# Patient Record
Sex: Female | Born: 1935 | Race: White | Hispanic: No | Marital: Single | Smoking: Never smoker
Health system: Southern US, Community
[De-identification: ages and names within clinical notes are randomized; demographics above are authoritative.]

## PROBLEM LIST (undated history)

## (undated) DIAGNOSIS — K5792 Diverticulitis of intestine, part unspecified, without perforation or abscess without bleeding: Secondary | ICD-10-CM

## (undated) DIAGNOSIS — I890 Lymphedema, not elsewhere classified: Secondary | ICD-10-CM

## (undated) DIAGNOSIS — I340 Nonrheumatic mitral (valve) insufficiency: Secondary | ICD-10-CM

## (undated) DIAGNOSIS — E119 Type 2 diabetes mellitus without complications: Secondary | ICD-10-CM

## (undated) DIAGNOSIS — G4730 Sleep apnea, unspecified: Secondary | ICD-10-CM

## (undated) DIAGNOSIS — M353 Polymyalgia rheumatica: Secondary | ICD-10-CM

## (undated) DIAGNOSIS — I10 Essential (primary) hypertension: Secondary | ICD-10-CM

## (undated) DIAGNOSIS — E785 Hyperlipidemia, unspecified: Secondary | ICD-10-CM

## (undated) DIAGNOSIS — M1990 Unspecified osteoarthritis, unspecified site: Secondary | ICD-10-CM

## (undated) DIAGNOSIS — C801 Malignant (primary) neoplasm, unspecified: Secondary | ICD-10-CM

## (undated) DIAGNOSIS — I5022 Chronic systolic (congestive) heart failure: Secondary | ICD-10-CM

## (undated) HISTORY — PX: CARPAL TUNNEL RELEASE: SHX101

## (undated) HISTORY — DX: Unspecified osteoarthritis, unspecified site: M19.90

## (undated) HISTORY — PX: MASTECTOMY MODIFIED RADICAL: SUR848

## (undated) HISTORY — DX: Polymyalgia rheumatica: M35.3

## (undated) HISTORY — DX: Type 2 diabetes mellitus without complications: E11.9

## (undated) HISTORY — PX: JOINT REPLACEMENT: SHX530

## (undated) HISTORY — DX: Diverticulitis of intestine, part unspecified, without perforation or abscess without bleeding: K57.92

## (undated) HISTORY — DX: Nonrheumatic mitral (valve) insufficiency: I34.0

## (undated) HISTORY — DX: Essential (primary) hypertension: I10

## (undated) HISTORY — DX: Hyperlipidemia, unspecified: E78.5

## (undated) HISTORY — PX: SIGMOIDECTOMY: SHX176

## (undated) HISTORY — PX: OTHER SURGICAL HISTORY: SHX169

## (undated) HISTORY — DX: Malignant (primary) neoplasm, unspecified: C80.1

## (undated) HISTORY — DX: Chronic systolic (congestive) heart failure: I50.22

---

## 1985-12-05 DIAGNOSIS — C801 Malignant (primary) neoplasm, unspecified: Secondary | ICD-10-CM

## 1985-12-05 HISTORY — PX: BREAST LUMPECTOMY: SHX2

## 1985-12-05 HISTORY — DX: Malignant (primary) neoplasm, unspecified: C80.1

## 2013-05-27 DIAGNOSIS — E7800 Pure hypercholesterolemia, unspecified: Secondary | ICD-10-CM | POA: Insufficient documentation

## 2013-05-27 DIAGNOSIS — M1990 Unspecified osteoarthritis, unspecified site: Secondary | ICD-10-CM | POA: Insufficient documentation

## 2013-05-27 DIAGNOSIS — D34 Benign neoplasm of thyroid gland: Secondary | ICD-10-CM | POA: Insufficient documentation

## 2013-05-27 DIAGNOSIS — E119 Type 2 diabetes mellitus without complications: Secondary | ICD-10-CM | POA: Insufficient documentation

## 2013-05-27 DIAGNOSIS — K5730 Diverticulosis of large intestine without perforation or abscess without bleeding: Secondary | ICD-10-CM | POA: Insufficient documentation

## 2013-05-27 DIAGNOSIS — Z853 Personal history of malignant neoplasm of breast: Secondary | ICD-10-CM | POA: Insufficient documentation

## 2013-05-27 DIAGNOSIS — I890 Lymphedema, not elsewhere classified: Secondary | ICD-10-CM | POA: Insufficient documentation

## 2013-07-02 ENCOUNTER — Encounter: Payer: Self-pay | Admitting: Family Medicine

## 2013-07-03 DIAGNOSIS — I340 Nonrheumatic mitral (valve) insufficiency: Secondary | ICD-10-CM | POA: Insufficient documentation

## 2013-07-05 ENCOUNTER — Encounter: Payer: Self-pay | Admitting: Family Medicine

## 2014-06-24 DIAGNOSIS — I447 Left bundle-branch block, unspecified: Secondary | ICD-10-CM | POA: Insufficient documentation

## 2014-11-20 ENCOUNTER — Ambulatory Visit: Payer: Self-pay | Admitting: Family Medicine

## 2014-12-08 ENCOUNTER — Encounter: Payer: Self-pay | Admitting: Family Medicine

## 2014-12-29 ENCOUNTER — Ambulatory Visit: Payer: Self-pay | Admitting: Gastroenterology

## 2015-01-05 ENCOUNTER — Encounter: Payer: Self-pay | Admitting: Family Medicine

## 2015-02-03 ENCOUNTER — Encounter
Admit: 2015-02-03 | Disposition: A | Discharge status: Home / Self Care | Payer: Self-pay | Attending: Family Medicine | Admitting: Family Medicine

## 2015-03-30 LAB — SURGICAL PATHOLOGY

## 2015-12-21 ENCOUNTER — Encounter: Payer: Self-pay | Admitting: Occupational Therapy

## 2015-12-21 ENCOUNTER — Ambulatory Visit: Payer: Medicare Other | Attending: Family Medicine | Admitting: Occupational Therapy

## 2015-12-21 DIAGNOSIS — I972 Postmastectomy lymphedema syndrome: Secondary | ICD-10-CM | POA: Insufficient documentation

## 2015-12-21 NOTE — Therapy (Signed)
[place] [facility] [address], [place], [address] Phone: [phone]   Fax:  [phone]  Occupational Therapy Treatment  Patient Details  Name: Natasha Young MRN: [id number] Date of Birth: Jun 18, 1933 No Data Recorded  Encounter Date: 12/21/2015      OT End of Session - 12/21/15 1819    Visit Number 1   Number of Visits 4   Date for OT Re-Evaluation 01/18/16   OT Start Time 1403   OT Stop Time 1456   OT Time Calculation (min) 53 min   Activity Tolerance Patient tolerated treatment well   Behavior During Therapy [facility] for tasks assessed/performed      Past Medical History  Diagnosis Date  . Cancer ([place])   . Diabetes mellitus without complication [facility])     Past Surgical History  Procedure Laterality Date  . Joint replacement    .  r hipreplacement    . Mastectomy modified radical Bilateral   . Carpal tunnel release Right     There were no vitals filed for this visit.  Visit Diagnosis:  Postmastectomy lymphedema syndrome - Plan: Ot plan of care cert/re-cert      Subjective Assessment - 12/21/15 1802    Subjective  We did roadtrip in Oct that  I drove most of the time , then flew down to [place] and carried suitcase and back pack - that was when my shoulder started bother me and  Ifelt like I was starting to get more swelling    Patient Stated Goals To get my lymphedema under control , new garments, and shoulder better    Currently in Pain? Yes   Pain Score 5    Pain Location Arm   Pain Orientation Left   Pain Descriptors / Indicators Aching;Pins and needles;Numbness   Pain Onset More than a month ago             LYMPHEDEMA/ONCOLOGY QUESTIONNAIRE - 12/21/15 1805    Type   Cancer Type L breast CA 23 yrs ago - with double mastectmies    Date Lymphedema/Swelling Started   Date --  Nov 16 flared up - but first episode 1985   What other symptoms do you have   Are you Having Heaviness or  Tightness Yes   Are you having Pain Yes   Are you having pitting edema No   Is it Hard or Difficult finding clothes that fit Yes   Do you have infections No   Lymphedema Stage   Stage STAGE 2 SPONTANEOUSLY IRREVERSIBLE   Right Upper Extremity Lymphedema   15 cm Proximal to Olecranon Process 40.5 cm   10 cm Proximal to Olecranon Process 36 cm   Olecranon Process 31.6 cm   15 cm Proximal to Ulnar Styloid Process 30.5 cm   10 cm Proximal to Ulnar Styloid Process 27 cm   Just Proximal to Ulnar Styloid Process 19.6 cm   Across Hand at [employer] 19.8 cm   At [place] of 2nd Digit 7.5 cm   At [facility] of Thumb 7.1 cm   Left Upper Extremity Lymphedema   15 cm Proximal to Olecranon Process 37.2 cm   10 cm Proximal to Olecranon Process 37.2 cm   Olecranon Process 34 cm   15 cm Proximal to Ulnar Styloid Process 35 cm   10 cm Proximal to Ulnar Styloid Process 30.6 cm   Just Proximal to Ulnar Styloid Process 20.8 cm   Across Hand at  [employer] 20.8 cm   At [place] of 2nd Digit 7.6 cm   At [facility] of Thumb 7.1 cm                         OT Education - 2016/01/02 1818    Education provided Yes   Education Details Findings of eval and plan    Person(s) Educated Patient   Methods Explanation;Demonstration;Tactile cues;Verbal cues   Comprehension Returned demonstration;Verbalized understanding          OT Short Term Goals - 01-02-2016 1826    OT SHORT TERM GOAL #1   Title Circumfernce in L hand decrease by at least .5 cm to fit in compressoin glove again    Baseline increase by .8 cm at wrist and hand    Time 2   Period Weeks   Status New           OT Long Term Goals - 02-Jan-2016 1827    OT LONG TERM GOAL #1   Title Pt to be ind in wearing of new power sleeve for night garment, new daytime compressoin sleeve  and compression bra   Baseline Pt has jovipak but do not wear a compression bra for thorasic lymphedeam    Time 4   Period Weeks   Status New                Plan - January 02, 2016 1820    Clinical Impression Statement Pt present iwth increase lymphdema in hand and wrist since year ago - pt report was not able to wear glove - and appear sleeve cause turniquet at wrist - causing some CTS in  L hand with increase use - pt was issud isotoner glove under night time sleeve to decrease measurments- wash and stretch  fingers of glove to assess if still fit - and then will remeasure pt on Friday - to be send for new nigh time powersleeve over Solaris , new daytime compression sleeve  / copmression bra to wear with high risk activities -    Pt will benefit from skilled therapeutic intervention in order to improve on the following deficits (Retired) Impaired UE functional use;Increased edema;Decreased strength   Rehab Potential Good   OT Frequency 1x / week   OT Duration 4 weeks   OT Treatment/Interventions Self-care/ADL training;Manual lymph drainage;Patient/family education   Plan Reassess circumference Friday    OT Home Exercise Plan Wear isotoner glove under night time Solaris   Consulted and Agree with Plan of Care Patient          G-Codes - 01/02/16 1836    Functional Assessment Tool Used Jeanie Cooks; circumference, ROM , pain .clinical judgement   Functional Limitation Self care   Self Care Current Status [phone]) At least 20 percent but less than 40 percent impaired, limited or restricted   Self Care Goal Status [id number]) At least 1 percent but less than 20 percent impaired, limited or restricted      Problem List There are no active problems to display for this patient.   Rosalyn Gess OTR/L,CLT Jan 02, 2016, 6:40 PM  [place] [facility] [address], [place], [address] Phone: [phone]   Fax:  [phone]  Name: DIANCA LECHMAN MRN: [id number] Date of Birth: Dec 02, 1933

## 2015-12-21 NOTE — Patient Instructions (Signed)
Pt to wear isotoner glove under night time compression sleeve to decrease hand measurements- and wash and stretch daytime compression glove - the fingers  Then wear during PT sessions this week - sleeve and glove -  Will remeasure on Friday

## 2015-12-25 ENCOUNTER — Ambulatory Visit: Payer: Medicare Other | Admitting: Occupational Therapy

## 2015-12-25 DIAGNOSIS — I972 Postmastectomy lymphedema syndrome: Secondary | ICD-10-CM

## 2015-12-25 NOTE — Therapy (Signed)
[place] [facility] [address] [place], [address] Phone: [phone]   Fax:  [phone]  Occupational Therapy Treatment  Patient Details  Name: Natasha Young MRN: [id number] Date of Birth: 1933/08/12 No Data Recorded  Encounter Date: 12/25/2015      OT End of Session - 12/25/15 1422    Visit Number 2   Number of Visits 4   Date for OT Re-Evaluation 01/18/16   OT Start Time 1314   OT Stop Time 1346   OT Time Calculation (min) 32 min   Activity Tolerance Patient tolerated treatment well   Behavior During Therapy [facility] for tasks assessed/performed      Past Medical History  Diagnosis Date  . Cancer ([place])   . Diabetes mellitus without complication [facility])     Past Surgical History  Procedure Laterality Date  . Joint replacement    .  r hipreplacement    . Mastectomy modified radical Bilateral   . Carpal tunnel release Right     There were no vitals filed for this visit.  Visit Diagnosis:  Postmastectomy lymphedema syndrome      Subjective Assessment - 12/25/15 1356    Subjective  I wore the glove you gave me to wear at night time under my night time garment - I had biopsy on my upper arm earlier this week - was able to wear my compression glove during day from 10am to about 7 pm but with  some pins and needles in my fingers    Patient Stated Goals To get my lymphedema under control , new garments, and shoulder better    Currently in Pain? No/denies             LYMPHEDEMA/ONCOLOGY QUESTIONNAIRE - 12/25/15 1319    Left Upper Extremity Lymphedema   15 cm Proximal to Olecranon Process 37 cm   10 cm Proximal to Olecranon Process 36.5 cm   Olecranon Process 34.5 cm   15 cm Proximal to Ulnar Styloid Process 34.1 cm   10 cm Proximal to Ulnar Styloid Process 30 cm   Just Proximal to Ulnar Styloid Process 19.3 cm   Across Hand at [employer] 20.8 cm   At [place] of 2nd Digit 7.1 cm   At [facility] of  Thumb 7 cm                 OT Treatments/Exercises (OP) - 12/25/15 0001    ADLs   ADL Comments Pt again ed on wearing excisting garments - pt to wear anothet week isotoner glove under night sleeve - and get measured for compressoin bra, daytme  daysleeve and glove -as well as powersleeve for night time   Manual Therapy   Manual therapy comments measurements taken - see flowsheet                 OT Education - 12/25/15 1422    Education provided Yes   Education Details New compresson needed   Person(s) Educated Patient   Methods Explanation;Demonstration   Comprehension Verbalized understanding          OT Short Term Goals - 12/21/15 1826    OT SHORT TERM GOAL #1   Title Circumfernce in L hand decrease by at least .5 cm to fit in compressoin glove again    Baseline increase by .8 cm at wrist and hand    Time 2   Period Weeks   Status New  OT Long Term Goals - 12/21/15 1827    OT LONG TERM GOAL #1   Title Pt to be ind in wearing of new power sleeve for night garment, new daytime compressoin sleeve  and compression bra   Baseline Pt has jovipak but do not wear a compression bra for thorasic lymphedeam    Time 4   Period Weeks   Status New               Plan - 12/25/15 1423    Clinical Impression Statement Pt measurements decrease in 2nd digiit - wrist  and even forearm and upper arm - elbow was about the same - pt had biiopsy done on upper arm earlier this week - pt in need for new compression day sleeve , glove , night time  powersleeve and copmression bra - with that she should be good  with working out  and CTS should decrease  - pt to contact this OT when garmens arrive    Pt will benefit from skilled therapeutic intervention in order to improve on the following deficits (Retired) Impaired UE functional use;Increased edema;Decreased strength   Rehab Potential Good   OT Frequency 1x / week   OT Duration 4 weeks   OT  Treatment/Interventions Self-care/ADL training;Manual lymph drainage;Patient/family education   Plan assess new compression garments    OT Home Exercise Plan Wear isotoner glove under night time Solar[employer]   Consulted and Agree with Plan of Care Patient        Problem List There are no active problems to display for this patient.   Rosalyn Gess OTR/L,CLT 12/25/2015, 2:27 PM  [place] [facility] [address] [place], [address] Phone: [phone]   Fax:  [phone]  Name: Natasha Young MRN: [id number] Date of Birth: Sep 17, 1933

## 2015-12-25 NOTE — Patient Instructions (Signed)
Same than last time - and get measured next week for new garments

## 2016-02-08 DIAGNOSIS — M2560 Stiffness of unspecified joint, not elsewhere classified: Secondary | ICD-10-CM | POA: Insufficient documentation

## 2016-02-09 ENCOUNTER — Other Ambulatory Visit: Payer: Self-pay | Admitting: Family Medicine

## 2016-02-09 DIAGNOSIS — Z780 Asymptomatic menopausal state: Secondary | ICD-10-CM

## 2016-02-09 DIAGNOSIS — Z79899 Other long term (current) drug therapy: Secondary | ICD-10-CM

## 2016-02-15 ENCOUNTER — Ambulatory Visit
Admission: RE | Admit: 2016-02-15 | Discharge: 2016-02-15 | Disposition: A | Discharge status: Home / Self Care | Payer: Medicare Other | Source: Ambulatory Visit | Attending: Family Medicine | Admitting: Family Medicine

## 2016-02-15 DIAGNOSIS — Z780 Asymptomatic menopausal state: Secondary | ICD-10-CM | POA: Diagnosis present

## 2016-02-15 DIAGNOSIS — Z79899 Other long term (current) drug therapy: Secondary | ICD-10-CM | POA: Diagnosis present

## 2016-02-16 DIAGNOSIS — I428 Other cardiomyopathies: Secondary | ICD-10-CM | POA: Insufficient documentation

## 2016-04-27 DIAGNOSIS — M160 Bilateral primary osteoarthritis of hip: Secondary | ICD-10-CM | POA: Insufficient documentation

## 2016-04-27 DIAGNOSIS — Z96641 Presence of right artificial hip joint: Secondary | ICD-10-CM | POA: Insufficient documentation

## 2016-07-08 DIAGNOSIS — M353 Polymyalgia rheumatica: Secondary | ICD-10-CM | POA: Insufficient documentation

## 2018-04-10 ENCOUNTER — Other Ambulatory Visit: Payer: Self-pay | Admitting: Family Medicine

## 2018-04-10 DIAGNOSIS — Z79899 Other long term (current) drug therapy: Secondary | ICD-10-CM

## 2018-04-10 DIAGNOSIS — M899 Disorder of bone, unspecified: Secondary | ICD-10-CM

## 2018-05-17 ENCOUNTER — Ambulatory Visit
Admission: RE | Admit: 2018-05-17 | Discharge: 2018-05-17 | Disposition: A | Discharge status: Home / Self Care | Payer: Medicare Other | Source: Ambulatory Visit | Attending: Family Medicine | Admitting: Family Medicine

## 2018-05-17 DIAGNOSIS — Z79899 Other long term (current) drug therapy: Secondary | ICD-10-CM | POA: Insufficient documentation

## 2018-05-17 DIAGNOSIS — M899 Disorder of bone, unspecified: Secondary | ICD-10-CM | POA: Diagnosis present

## 2018-07-23 ENCOUNTER — Other Ambulatory Visit: Payer: Self-pay

## 2018-07-23 ENCOUNTER — Ambulatory Visit: Payer: Medicare Other | Attending: Nuclear Medicine | Admitting: Occupational Therapy

## 2018-07-23 DIAGNOSIS — I972 Postmastectomy lymphedema syndrome: Secondary | ICD-10-CM | POA: Diagnosis present

## 2018-07-23 NOTE — Therapy (Signed)
[place] [facility] [address], [place], [address] Phone: [phone]   Fax:  [phone]  Occupational Therapy Evaluation  Patient Details  Name: Natasha Young MRN: [id number] Date of Birth: 1933/03/06 Referring Provider: Hoy Morn   Encounter Date: 07/23/2018  OT End of Session - 07/23/18 2053    Visit Number  1    Number of Visits  6    Date for OT Re-Evaluation  09/03/18    OT Start Time  1350    OT Stop Time  1440    OT Time Calculation (min)  50 min    Activity Tolerance  Patient tolerated treatment well    Behavior During Therapy  [facility] for tasks assessed/performed       Past Medical History:  Diagnosis Date  . Cancer ([place])   . Diabetes mellitus without complication ([place])     The histories are not reviewed yet. Please review them in the "History" navigator section and refresh this [place].  There were no vitals filed for this visit.  Subjective Assessment - 07/23/18 2041    Subjective   I had catch scratch about month ago on my R arm  - and lymphfluid was just dripping out of my L wrist 2 days later -and then it scratch me again about day ago - I did take month ago an antibiotic - but it feels like my forearm and wrist is more swollen - I was wondering about if I can get a pump  for my L UE lymphedeam and trunk - I do not wear a bra  - so I do not wear the jovipak breast little pad     Patient Stated Goals  To get my lymphedema in my L arm  under control , new garments and maybe pump     Currently in Pain?  No/denies        [facility] OT Assessment - 07/23/18 0001      Assessment   Medical Diagnosis  L UE and thoracic lymphedema     Referring Provider  [place]    Onset Date/Surgical Date  05/05/18    Hand Dominance  Right      Precautions   Precaution Comments  lymphedema      Home  Environment   Lives With  Spouse      Prior Function   Vocation  Retired       LYMPHEDEMA/ONCOLOGY  QUESTIONNAIRE - 07/23/18 1406      Right Upper Extremity Lymphedema   15 cm Proximal to Olecranon Process  39 cm    10 cm Proximal to Olecranon Process  35.5 cm    Olecranon Process  32.5 cm    15 cm Proximal to Ulnar Styloid Process  30.5 cm    10 cm Proximal to Ulnar Styloid Process  27 cm    Just Proximal to Ulnar Styloid Process  20 cm    Across Hand at [employer]  20 cm    At [place] of 2nd Digit  7 cm    At [facility] of Thumb  7 cm      Left Upper Extremity Lymphedema   15 cm Proximal to Olecranon Process  38.5 cm    10 cm Proximal to Olecranon Process  37.3 cm    Olecranon Process  34.5 cm    15 cm Proximal to Ulnar Styloid Process  35.5 cm    10 cm Proximal to Ulnar Styloid Process  32 cm    Just Proximal to Ulnar Styloid Process  20 cm    Across Hand at [employer]  20.4 cm    At [place] of 2nd Digit  7.1 cm    At [facility] of Thumb  7.3 cm         Pt cont with Solaris compression with power sleeve over at night tine  pt's daytime compresson is more than 82 yrs old- pt provided with isotoner glove and tubigrip D  Over hand to elbow For daytime  To wear for week  Need new one - but want to decrease forearm and wrist prior to measurement  Pt not candidate for pump  because of her cardiac history             OT Education - 07/23/18 2053    Education provided  Yes    Education Details  findings of eval and homeprogram - POC    Person(s) Educated  Patient    Methods  Explanation;Demonstration    Comprehension  Verbalized understanding;Returned demonstration       OT Short Term Goals - 12/21/15 1826      OT SHORT TERM GOAL #1   Title  Circumfernce in L hand decrease by at least .5 cm to fit in compressoin glove again     Baseline  increase by .8 cm at wrist and hand     Time  2    Period  Weeks    Status  New        OT Long Term Goals - 07/23/18 2102      OT LONG TERM GOAL #1   Title  L UE circumfernce decrease in forearm and wrist to get measured for  new daytime compression sleeve     Baseline  increase compare to last time and 5 cm in forearm increase compare to the R -    Time  3    Period  Weeks    Status  New    Target Date  08/13/18      OT LONG TERM GOAL #2   Title  Pt to be independent in wearing correct compression to maintain lymphedema circumference in L UE to prevent infection     Baseline  Pt had infection from cat scratch to R hand -and arrive at eval with another cat scratch     Time  6    Period  Weeks    Status  New    Target Date  09/03/18            Plan - 07/23/18 2054    Clinical Impression Statement  Pt was seen in 2/17 the last time for her L UE and thoracic lymphedema - pt return this date for evaluation- per pt her cat scratch her on the R arm and 2 days later her L wrist was oozzing and draining lymph fluid - she went on antibiotic - she arrive this date again with new cat scratch on R hand - pt daytime compression sleeve - Jobst elvarex soft - is mroe than 82 years old - and not provided enough compression anymore - her measurements increaes 2 cm in foream , 0.7 at wrist, and 1.5 cm at upper arm compare to Febr 17 - Pt cont to be increase now compare to the R arm - 5 cm at forearm , 2 at elbow and 1.3 at upper arm - explain to pt that I would not recommend pump for her because  of her cardiac history - pt was SOB just walking in the clinic , increase edema in bilateral LE , EF per pt 22% and  on low doxe predisone - plan to decrease forearm and wrist measurements for pt to get measure for new daytime sleeve - night time Solaris still providing enough compression -    Occupational performance deficits (Please refer to evaluation for details):  ADL's;Rest and Sleep    Rehab Potential  Good    Current Impairments/barriers affecting progress:  Had lymphedema since 1985    OT Frequency  1x / week    OT Duration  6 weeks    OT Treatment/Interventions  Self-care/ADL training;Manual lymph drainage;Patient/family  education    Plan  assess measurements in week if decrease and can get measure for daytime garment    Clinical Decision Making  Limited treatment options, no task modification necessary    OT Home Exercise Plan  see pt instruction    Consulted and Agree with Plan of Care  Patient       Patient will benefit from skilled therapeutic intervention in order to improve the following deficits and impairments:  Increased edema, Impaired UE functional use  Visit Diagnosis: Postmastectomy lymphedema syndrome - Plan: Ot plan of care cert/re-cert    Problem List There are no active problems to display for this patient.   Rosalyn Gess OTR/l,CLT 07/23/2018, 9:07 PM  [place] [facility] [address] [place], [address] Phone: [phone]   Fax:  [phone]  Name: Natasha Young MRN: [id number] Date of Birth: 02-03-1933

## 2018-07-23 NOTE — Patient Instructions (Signed)
Pt cont with Solaris compression with power sleeve over at night tine  pt's daytime compresson is more than 82 yrs old- pt provided with isotoner glove and tubigrip D  Over hand to elbow For daytime  Need new one - but want to decrease forearm and wrist prior to measurement  Pt not candidate because of her cardiac history

## 2018-07-31 ENCOUNTER — Ambulatory Visit: Payer: Medicare Other | Admitting: Occupational Therapy

## 2018-07-31 DIAGNOSIS — I972 Postmastectomy lymphedema syndrome: Secondary | ICD-10-CM | POA: Diagnosis not present

## 2018-07-31 NOTE — Patient Instructions (Signed)
Same as last time  

## 2018-07-31 NOTE — Therapy (Signed)
[place] [facility] [address], [place], [address] Phone: [phone]   Fax:  [phone]  Occupational Therapy Treatment  Patient Details  Name: Natasha Young MRN: [id number] Date of Birth: [birth year] Referring Provider: Hoy Morn   Encounter Date: 07/31/2018  OT End of Session - 07/31/18 0932    Visit Number  2    Number of Visits  6    Date for OT Re-Evaluation  09/03/18    OT Start Time  0900    OT Stop Time  0930    OT Time Calculation (min)  30 min    Activity Tolerance  Patient tolerated treatment well    Behavior During Therapy  [facility] for tasks assessed/performed       Past Medical History:  Diagnosis Date  . Cancer ([place])   . Diabetes mellitus without complication ([place])     The histories are not reviewed yet. Please review them in the "History" navigator section and refresh this [place].  There were no vitals filed for this visit.  Subjective Assessment - 07/31/18 0930    Subjective   I just got up - and kept my Tribute on so you can see my arm - and  I did wear the daytime sleeve with glove and tubigrip -     Patient Stated Goals  To get my lymphedema in my L arm  under control , new garments and maybe pump     Currently in Pain?  No/denies          LYMPHEDEMA/ONCOLOGY QUESTIONNAIRE - 07/31/18 0909      Left Upper Extremity Lymphedema   15 cm Proximal to Olecranon Process  38.5 cm    10 cm Proximal to Olecranon Process  37 cm    Olecranon Process  34.5 cm    15 cm Proximal to Ulnar Styloid Process  35 cm    10 cm Proximal to Ulnar Styloid Process  30.4 cm    Just Proximal to Ulnar Styloid Process  19 cm    Across Hand at [employer]  20 cm        Pt arrive with her Tribute night time sleeve on - still have dimples on forearm and upper arm - but would recommend getting new powersleeve    Measurements taken for L UE - see flowsheet- wrist and forearm did decrease   pt's daytime  compresson is more than 82 yrs old- pt provided with isotoner glove and new tubigrip D  Over hand to elbow For daytime  To wear for another week  Need new daytime sleeve  - but want to decrease forearm and wrist prior to measurement  Pt not candidate for pump  because of her cardiac history               OT Education - 07/31/18 0932    Education provided  Yes    Education Details  progress and cont to compression for another week     Person(s) Educated  Patient    Methods  Explanation;Demonstration    Comprehension  Verbalized understanding;Returned demonstration       OT Short Term Goals - 12/21/15 1826      OT SHORT TERM GOAL #1   Title  Circumfernce in L hand decrease by at least .5 cm to fit in compressoin glove again     Baseline  increase by .8 cm at wrist and hand     Time  2    Period  Weeks    Status  New        OT Long Term Goals - 07/23/18 2102      OT LONG TERM GOAL #1   Title  L UE circumfernce decrease in forearm and wrist to get measured for new daytime compression sleeve     Baseline  increase compare to last time and 5 cm in forearm increase compare to the R -    Time  3    Period  Weeks    Status  New    Target Date  08/13/18      OT LONG TERM GOAL #2   Title  Pt to be independent in wearing correct compression to maintain lymphedema circumference in L UE to prevent infection     Baseline  Pt had infection from cat scratch to R hand -and arrive at eval with another cat scratch     Time  6    Period  Weeks    Status  New    Target Date  09/03/18            Plan - 07/31/18 0933    Clinical Impression Statement  Pt did show decrease circumference at wrist and forearm since last week - pt to cont with her old daysleeve with isotoner glove and tubigrip - for another week and should be ready to get measured for new daysleeve and glove - her Lelan Pons she arrive in with power sleeve - and had some dimples on forearm and upper arm - would  recommend new powersleeve     Occupational performance deficits (Please refer to evaluation for details):  ADL's;Rest and Sleep    Rehab Potential  Good    Current Impairments/barriers affecting progress:  Had lymphedema since 1985    OT Frequency  1x / week    OT Duration  6 weeks    OT Treatment/Interventions  Self-care/ADL training;Manual lymph drainage;Patient/family education    Plan  assess measurements in week if decrease and can get measure for daytime garment    Clinical Decision Making  Limited treatment options, no task modification necessary    OT Home Exercise Plan  see pt instruction    Consulted and Agree with Plan of Care  Patient       Patient will benefit from skilled therapeutic intervention in order to improve the following deficits and impairments:  Increased edema, Impaired UE functional use  Visit Diagnosis: Postmastectomy lymphedema syndrome    Problem List There are no active problems to display for this patient.   Rosalyn Gess OTR/L,CLT 07/31/2018, 9:35 AM  [place] [facility] [address] [place], [address] Phone: [phone]   Fax:  [phone]  Name: MERVE HOTARD MRN: [id number] Date of Birth: [birth year]

## 2018-08-07 ENCOUNTER — Ambulatory Visit: Payer: Medicare Other | Attending: Family Medicine | Admitting: Occupational Therapy

## 2018-08-07 DIAGNOSIS — I972 Postmastectomy lymphedema syndrome: Secondary | ICD-10-CM | POA: Diagnosis not present

## 2018-08-07 NOTE — Patient Instructions (Signed)
Same HEP - and get measured for garments next week

## 2018-08-07 NOTE — Therapy (Signed)
[facility] [address], [place], [address] Phone: [phone]   Fax:  [phone]  Occupational Therapy Treatment  Patient Details  Name: Natasha Young MRN: [id number] Date of Birth: Jun 17, 1933 Referring Provider: Hoy Morn   Encounter Date: 08/07/2018  OT End of Session - 08/07/18 1030    Visit Number  3    Number of Visits  6    Date for OT Re-Evaluation  09/03/18    OT Start Time  0951    OT Stop Time  1010    OT Time Calculation (min)  19 min    Activity Tolerance  Patient tolerated treatment well    Behavior During Therapy  [facility] for tasks assessed/performed       Past Medical History:  Diagnosis Date  . Cancer ([place])   . Diabetes mellitus without complication ([place])     The histories are not reviewed yet. Please review them in the "History" navigator section and refresh this [place].  There were no vitals filed for this visit.  Subjective Assessment - 08/07/18 1027    Subjective   Today it maybe not a good day for me to come and get measured - I ate [place] food last night with  a lot of sodium     Patient Stated Goals  To get my lymphedema in my L arm  under control , new garments and maybe pump     Currently in Pain?  Yes    Pain Score  4     Pain Location  Elbow    Pain Orientation  Left    Pain Descriptors / Indicators  Aching    Pain Type  Acute pain    Multiple Pain Sites  No          LYMPHEDEMA/ONCOLOGY QUESTIONNAIRE - 08/07/18 0954      Left Upper Extremity Lymphedema   15 cm Proximal to Olecranon Process  38.2 cm    10 cm Proximal to Olecranon Process  37 cm    Olecranon Process  34 cm    15 cm Proximal to Ulnar Styloid Process  35 cm    10 cm Proximal to Ulnar Styloid Process  31.2 cm    Just Proximal to Ulnar Styloid Process  19 cm    Across Hand at [employer]  20 cm         Pt arrive with her daytime Jobst Elvarex old sleeve and tubigrip over forearm and  isotoner glove  She wear her Tribute night time sleeve at night time - still have dimples on forearm and upper arm -pt  getting new powersleeve    Measurements taken for L UE - see flowsheet- decrease again - even with her eating high sodium meal yesterday   pt's daytime compresson is more than 82 yrs old- pt provided with isotoner glove and new tubigrip D Over hand to elbow For daytimeand to get measured for new daytime sleeve this time Elvarex soft  Pt not candidatefor pumpbecause of her cardiac history  Pt to contact me when she get her garments                OT Education - 08/07/18 1029    Education provided  Yes    Education Details  get measured and when to replace and come back for assessess fit    Person(s) Educated  Patient    Methods  Demonstration    Comprehension  Verbalized  understanding;Returned demonstration          OT Long Term Goals - 07/23/18 2102      OT LONG TERM GOAL #1   Title  L UE circumfernce decrease in forearm and wrist to get measured for new daytime compression sleeve     Baseline  increase compare to last time and 5 cm in forearm increase compare to the R -    Time  3    Period  Weeks    Status  New    Target Date  08/13/18      OT LONG TERM GOAL #2   Title  Pt to be independent in wearing correct compression to maintain lymphedema circumference in L UE to prevent infection     Baseline  Pt had infection from cat scratch to R hand -and arrive at eval with another cat scratch     Time  6    Period  Weeks    Status  New    Target Date  09/03/18            Plan - 08/07/18 1032    Clinical Impression Statement  Pt showed decrease circumference in L UE with increase compression over her old sleeve - even with her increase sodium meal yesterday - pt to get measured for new Tribute powersleeve and then daytime  Jobst Elvarex soft     Occupational performance deficits (Please refer to evaluation for details):  ADL's;Rest and  Sleep    Rehab Potential  Good    Current Impairments/barriers affecting progress:  Had lymphedema since 1985    OT Frequency  1x / week    OT Duration  6 weeks    OT Treatment/Interventions  Self-care/ADL training;Manual lymph drainage;Patient/family education    Plan  assess measurements and fit of garments     Clinical Decision Making  Limited treatment options, no task modification necessary    OT Home Exercise Plan  see pt instruction    Consulted and Agree with Plan of Care  Patient       Patient will benefit from skilled therapeutic intervention in order to improve the following deficits and impairments:  Increased edema, Impaired UE functional use  Visit Diagnosis: Postmastectomy lymphedema syndrome    Problem List There are no active problems to display for this patient.   Rosalyn Gess OTR/L,CLT 08/07/2018, 10:35 AM  [facility] [address], [place], [address] Phone: [phone]   Fax:  [phone]  Name: Natasha Young MRN: [id number] Date of Birth: [birth year]

## 2019-05-31 DIAGNOSIS — I5022 Chronic systolic (congestive) heart failure: Secondary | ICD-10-CM | POA: Insufficient documentation

## 2019-07-03 DIAGNOSIS — Z950 Presence of cardiac pacemaker: Secondary | ICD-10-CM

## 2019-07-03 HISTORY — PX: INSERT / REPLACE / REMOVE PACEMAKER: SUR710

## 2019-07-03 HISTORY — DX: Presence of cardiac pacemaker: Z95.0

## 2019-07-04 DIAGNOSIS — Z95810 Presence of automatic (implantable) cardiac defibrillator: Secondary | ICD-10-CM | POA: Insufficient documentation

## 2020-03-10 DIAGNOSIS — N321 Vesicointestinal fistula: Secondary | ICD-10-CM | POA: Insufficient documentation

## 2020-06-23 ENCOUNTER — Other Ambulatory Visit: Payer: Self-pay | Admitting: Family Medicine

## 2020-06-23 DIAGNOSIS — Z7952 Long term (current) use of systemic steroids: Secondary | ICD-10-CM

## 2020-06-23 DIAGNOSIS — Z780 Asymptomatic menopausal state: Secondary | ICD-10-CM

## 2020-10-05 ENCOUNTER — Ambulatory Visit (INDEPENDENT_AMBULATORY_CARE_PROVIDER_SITE_OTHER): Payer: Medicare Other | Admitting: Internal Medicine

## 2020-10-05 DIAGNOSIS — Z7189 Other specified counseling: Secondary | ICD-10-CM | POA: Diagnosis not present

## 2020-10-05 DIAGNOSIS — Z9989 Dependence on other enabling machines and devices: Secondary | ICD-10-CM

## 2020-10-05 DIAGNOSIS — I5022 Chronic systolic (congestive) heart failure: Secondary | ICD-10-CM | POA: Diagnosis not present

## 2020-10-05 DIAGNOSIS — G4733 Obstructive sleep apnea (adult) (pediatric): Secondary | ICD-10-CM | POA: Diagnosis not present

## 2020-10-05 NOTE — Progress Notes (Signed)
[facility] [place], [place] [address]  Pulmonary Sleep Medicine   Office Visit Note  Patient Name: Natasha Young DOB: [birth year] MRN [id number]    Chief Complaint: Obstructive Sleep Apnea visit  Brief History:  Natasha Young is seen today for follow up The patient has a 2 year history of sleep apnea. Patient is using PAP nightly.  The patient feels more rested after sleeping with PAP.  The patient reports beneft from PAP use. Reported sleepiness is  improved and the Epworth Sleepiness Score is 3 out of 24. The patient is not taking naps. The patient complains of the following: dry mouth. This will cause her to remove the mask.The humidifier is set on 6.  The compliance download shows excellent compliance with an average use time of 6.7 hours. The AHI is 5.2  The patient does not complain of limb movements disrupting sleep.  ROS  General: (-) fever, (-) chills, (-) night sweat Nose and Sinuses: (-) nasal stuffiness or itchiness, (-) postnasal drip, (-) nosebleeds, (-) sinus trouble. Mouth and Throat: (-) sore throat, (-) hoarseness. Neck: (-) swollen glands, (-) enlarged thyroid, (-) neck pain. Respiratory: - cough, - shortness of breath, - wheezing. Neurologic: - numbness, - tingling. Psychiatric: - anxiety, - depression   Current Medication: Outpatient Encounter Medications as of 84/12/2019  Medication Sig  . carvedilol (COREG) 3.125 MG tablet TAKE (1) TABLET BY MOUTH TWICE DAILY WITH MEALS  . DULoxetine (CYMBALTA) 30 MG capsule Take by mouth.  Marland Kitchen LORazepam (ATIVAN) 1 MG tablet Take by mouth.  . metFORMIN (GLUCOPHAGE) 500 MG tablet Take by mouth.  . sacubitril-valsartan (ENTRESTO) 24-26 MG TAKE (1) TABLET BY MOUTH EVERY 12 HOURS  . calcium carbonate (OS-CAL - DOSED IN MG OF ELEMENTAL CALCIUM) 1250 (500 Ca) MG tablet Take by mouth.  . Multiple Vitamin (MULTI-VITAMIN) tablet Take 1 tablet by mouth daily.   No facility-administered encounter medications on  file as of 10/05/2020.    Surgical History: Past Surgical History:  Procedure Laterality Date  .  R hipreplacement    . CARPAL TUNNEL RELEASE Right   . JOINT REPLACEMENT    . MASTECTOMY MODIFIED RADICAL Bilateral   . SIGMOIDECTOMY      Medical History: Past Medical History:  Diagnosis Date  . Cancer ([place])   . Chronic systolic HF (heart failure) ([place])   . Diabetes mellitus without complication ([place])   . Diverticulitis   . HLD (hyperlipidemia)   . HTN (hypertension)   . Mitral insufficiency   . OA (osteoarthritis)   . PMR (polymyalgia rheumatica) ([facility])   . T2DM (type 2 diabetes mellitus) ([place])     Family History: Non contributory to the present illness  Social History: Social History   Socioeconomic History  . Marital status: Married    Spouse name: Not on file  . Number of children: Not on file  . Years of education: Not on file  . Highest education level: Not on file  Occupational History  . Not on file  Tobacco Use  . Smoking status: Never Smoker  . Smokeless tobacco: Never Used  Substance and Sexual Activity  . Alcohol use: Not on file  . Drug use: Not on file  . Sexual activity: Not on file  Other Topics Concern  . Not on file  Social History Narrative  . Not on file   Social Determinants of Health   Financial Resource Strain:   . Difficulty of Paying Living Expenses: Not on file  Food  Insecurity:   . Worried About Charity fundraiser in the Last Year: Not on file  . Ran Out of Food in the Last Year: Not on file  Transportation Needs:   . Lack of Transportation (Medical): Not on file  . Lack of Transportation (Non-Medical): Not on file  Physical Activity:   . Days of Exercise per Week: Not on file  . Minutes of Exercise per Session: Not on file  Stress:   . Feeling of Stress : Not on file  Social Connections:   . Frequency of Communication with Friends and Family: Not on file  . Frequency of Social Gatherings with Friends and Family: Not on  file  . Attends Religious Services: Not on file  . Active Member of Clubs or Organizations: Not on file  . Attends Archivist Meetings: Not on file  . Marital Status: Not on file  Intimate Partner Violence:   . Fear of Current or Ex-Partner: Not on file  . Emotionally Abused: Not on file  . Physically Abused: Not on file  . Sexually Abused: Not on file    Vital Signs: Blood pressure 122/72, pulse 88, height 5\' 3"  (1.6 m), weight 204 lb (92.5 kg), SpO2 96 %.  Examination: General Appearance: The patient is well-developed, well-nourished, and in no distress. Neck Circumference: 39 Skin: Gross inspection of skin unremarkable. Head: normocephalic, no gross deformities. Eyes: no gross deformities noted. ENT: ears appear grossly normal Neurologic: Alert and oriented. No involuntary movements.    EPWORTH SLEEPINESS SCALE:  Scale:  (0)= no chance of dozing; (1)= slight chance of dozing; (2)= moderate chance of dozing; (3)= high chance of dozing  Chance  Situtation    Sitting and reading: 0    Watching TV: 0    Sitting Inactive in public: 0    As a passenger in car: 0      Lying down to rest: 3    Sitting and talking: 0    Sitting quielty after lunch: 0    In a car, stopped in traffic: 0   TOTAL SCORE:   3 out of 24    SLEEP STUDIES:  1. PSG 04/16/18 AHI 54 SpO36min 79%   CPAP COMPLIANCE DATA:  Date Range: 10/03/19-10/01/20  Average Daily Use: 6.7 hours  Median Use: 6.9  Compliance for > 4 Hours: 85%  AHI: 5.3 respiratory events per hour  Days Used: 341/365  Mask Leak: 27.6  95th Percentile Pressure: 15.2         LABS: No results found for this or any previous visit (from the past 2160 hour(s)).  Radiology: DG BONE DENSITY (DXA)  Result Date: 05/17/2018 EXAM: DUAL X-RAY ABSORPTIOMETRY (DXA) FOR BONE MINERAL DENSITY IMPRESSION: Dear Dr Hortencia Pilar, Your patient Natasha Young completed a BMD test on 05/17/2018 using the [place] (analysis version: 14.10) manufactured by [employer]. The following summarizes the results of our evaluation. PATIENT BIOGRAPHICAL: Name: Natasha, Young Patient ID: [id number] Birth Date: [birth year] Height: 64.0 in. Gender: Female Exam Date: 05/17/2018 Weight: 200.8 lbs. Indications: Advanced Age, Bilateral Ovariectomy, Caucasian, diabetic, high risk meds, hip replacement, hx breast ca, hx steroid use, Hysterectomy, polymyalgia rheumatica, POSTmenopausal, sleep apnea Fractures: Treatments: Calcium, metformin, prednisone ASSESSMENT: The BMD measured at Femur Neck is 1.005 g/cm2 with a T-score of -0.2. This patient is considered normal according to [facility]) criteria. L- 3 was excluded due to degenerative changes. Site Region Measured Measured WHO Young Adult BMD Date  Age      Classification T-score AP Spine L1-L4 (L3) 05/17/2018 82.9 Normal 2.1 1.424 g/cm2 AP Spine L1-L4 (L3) 02/15/2016 80.6 Normal 2.6 1.481 g/cm2 AP Spine L1-L4 (L3) 11/20/2014 79.4 Normal 1.9 1.414 g/cm2 Left Femur Neck 05/17/2018 82.9 Normal -0.2 1.005 g/cm2 Left Femur Neck 02/15/2016 80.6 Normal -0.3 0.993 g/cm2 Left Femur Neck 11/20/2014 79.4 Normal -0.6 0.956 g/cm2 World Health Organization [facility]) criteria for post-menopausal, Caucasian Women: Normal:       T-score at or above -1 SD Osteopenia:   T-score between -1 and -2.5 SD Osteoporosis: T-score at or below -2.5 SD RECOMMENDATIONS: 1. All patients should optimize calcium and vitamin D intake. 2. Consider FDA-approved medical therapies in postmenopausal women and men aged 66 years and older, based on the following: a. A hip or vertebral (clinical or morphometric) fracture b. T-score < -2.5 at the femoral neck or spine after appropriate evaluation to exclude secondary causes c. Low bone mass (T-score between -1.0 and -2.5 at the femoral neck or spine) and a 10-year probability of a hip fracture > 3% or a 10-year probability of a major  osteoporosis-related fracture > 20% based on the [place]-adapted WHO algorithm d. Clinician judgment and/or patient preferences may indicate treatment for people with 10-year fracture probabilities above or below these levels FOLLOW-UP: Patients with diagnosed cases of osteoporosis or at high risk for fracture should have regular bone mineral density tests. For patients eligible for Medicare, routine testing is allowed once every 2 years. The testing frequency can be increased to one year for patients who have rapidly progressing disease, those who are receiving or discontinuing medical therapy to restore bone mass, or have additional risk factors. I have reviewed this report, and agree with the above findings. [facility] Radiology Electronically Signed   By: Lowella Grip III M.D.   On: 05/17/2018 11:48    No results found.  No results found.    Assessment and Plan: Patient Active Problem List   Diagnosis Date Noted  . OSA on CPAP 10/05/2020  . CPAP use counseling 10/05/2020  . Chronic systolic heart failure ([place]) 10/05/2020      The patient does tolerate PAP and reports significant benefit from PAP use. The patient was reminded how to fit her mask and we adjusted her humidifier setting.  . The compliance is very good. The AHI is slightly elevated at 5.3.At this point the apnea is reasonably well controlled and not very variable so there does not appear to be a need to switch to the ASV. She denies shortness of breath since her pacemaker.   1. OSA- continue nightly use. Work on mask fit. 2. CPAP couseling-Discussed importance of adequate CPAP use as well as proper care and cleaning techniques of machine and all supplies. 3. CHF-Reports stability at this time, followed and managed by cardiology  General Counseling: I have discussed the findings of the evaluation and examination with Izora Gala.  I have also discussed any further diagnostic evaluation thatmay be needed or ordered today. Tangela  verbalizes understanding of the findings of todays visit. We also reviewed her medications today and discussed drug interactions and side effects including but not limited excessive drowsiness and altered mental states. We also discussed that there is always a risk not just to her but also people around her. she has been encouraged to call the office with any questions or concerns that should arise related to todays visit.  No orders of the defined types were placed in this encounter.  I have personally obtained a history, examined the patient, evaluated laboratory and imaging results, formulated the assessment and plan and placed orders.   Richelle Ito Saunders Glance, PhD, FAASM  Diplomate, American Board of Sleep Medicine    Allyne Gee, MD [facility] Diplomate ABMS Pulmonary and Critical Care Medicine Sleep medicine

## 2020-10-05 NOTE — Patient Instructions (Signed)

## 2020-11-03 ENCOUNTER — Other Ambulatory Visit: Payer: Self-pay

## 2020-11-03 ENCOUNTER — Ambulatory Visit
Admission: RE | Admit: 2020-11-03 | Discharge: 2020-11-03 | Disposition: A | Discharge status: Home / Self Care | Payer: Medicare Other | Source: Ambulatory Visit | Attending: Family Medicine | Admitting: Family Medicine

## 2020-11-03 DIAGNOSIS — Z780 Asymptomatic menopausal state: Secondary | ICD-10-CM | POA: Diagnosis not present

## 2020-11-03 DIAGNOSIS — Z13820 Encounter for screening for osteoporosis: Secondary | ICD-10-CM | POA: Diagnosis not present

## 2020-11-03 DIAGNOSIS — Z853 Personal history of malignant neoplasm of breast: Secondary | ICD-10-CM | POA: Insufficient documentation

## 2020-11-03 DIAGNOSIS — Z7952 Long term (current) use of systemic steroids: Secondary | ICD-10-CM | POA: Diagnosis not present

## 2020-11-03 DIAGNOSIS — G4730 Sleep apnea, unspecified: Secondary | ICD-10-CM | POA: Diagnosis not present

## 2020-11-03 DIAGNOSIS — Z96641 Presence of right artificial hip joint: Secondary | ICD-10-CM | POA: Diagnosis not present

## 2021-02-28 ENCOUNTER — Ambulatory Visit
Admission: EM | Admit: 2021-02-28 | Discharge: 2021-02-28 | Disposition: A | Discharge status: Home / Self Care | Payer: Medicare Other | Attending: Emergency Medicine | Admitting: Emergency Medicine

## 2021-02-28 ENCOUNTER — Other Ambulatory Visit: Payer: Self-pay

## 2021-02-28 DIAGNOSIS — L03114 Cellulitis of left upper limb: Secondary | ICD-10-CM

## 2021-02-28 DIAGNOSIS — A281 Cat-scratch disease: Secondary | ICD-10-CM

## 2021-02-28 MED ORDER — AZITHROMYCIN 250 MG PO TABS
250.0000 mg | ORAL_TABLET | Freq: Every day | ORAL | 0 refills | Status: DC
Start: 2021-02-28 — End: 2021-10-11

## 2021-02-28 MED ORDER — DOXYCYCLINE HYCLATE 100 MG PO CAPS: 100.0000 mg | ORAL_CAPSULE | Freq: Two times a day (BID) | ORAL | 0 refills | Status: DC

## 2021-02-28 NOTE — ED Triage Notes (Signed)
Pt c/o possible cellulitis in her left arm after being scratched by her cat about a week ago. Pt's left arm is swollen, red and painful.

## 2021-02-28 NOTE — Discharge Instructions (Addendum)
Take the azithromycin for treatment of the cat scratch disease.  Take 2 tablets on day 1 and then 1 tablet each day after that for total of 5 days treatment.  Take the doxycycline twice daily with food for 10 days for treatment of the cellulitis.  Take an over-the-counter probiotic 1 hour after each dose of antibiotic to help prevent diarrhea from forming as a result of the antibiotic therapy.  If you have an increase in your redness, increased pain, increase in swelling, fever, nausea or vomiting, dizziness, or fatigue return for reevaluation or go to the emergency department for evaluation.

## 2021-02-28 NOTE — ED Provider Notes (Signed)
[facility]    CSN: [id number] Arrival date & time: 02/28/21  1510      History   Chief Complaint Chief Complaint  Patient presents with  . Cellulitis    Left arm    HPI Natasha Young is a 87 y.o. female.   HPI   87 year old female here for evaluation of swollen, red, painful left arm.  Patient reports that she was scratched on her left thumb by her cat a week ago and then last night developed the redness, pain, and swelling to her left arm.  The redness is currently extends from her wrist to just below her shoulder.  Patient has a history of lymphedema in that arm and typically wears a compression sleeve but she is not currently due to having possible cellulitis.  Patient was she is also had a headache but she denies fever.  Patient denies any dizziness, chills, or chest pain.  No syncope.  Past Medical History:  Diagnosis Date  . Cancer ([place])   . Chronic systolic HF (heart failure) ([place])   . Diabetes mellitus without complication ([place])   . Diverticulitis   . HLD (hyperlipidemia)   . HTN (hypertension)   . Mitral insufficiency   . OA (osteoarthritis)   . PMR (polymyalgia rheumatica) ([facility])   . T2DM (type 2 diabetes mellitus) [facility])     Patient Active Problem List   Diagnosis Date Noted  . OSA on CPAP 10/05/2020  . CPAP use counseling 10/05/2020  . Chronic systolic heart failure ([place]) 10/05/2020    Past Surgical History:  Procedure Laterality Date  .  R hipreplacement    . CARPAL TUNNEL RELEASE Right   . JOINT REPLACEMENT    . MASTECTOMY MODIFIED RADICAL Bilateral   . SIGMOIDECTOMY      OB History   No obstetric history on file.      Home Medications    Prior to Admission medications   Medication Sig Start Date End Date Taking? Authorizing Provider  azithromycin (ZITHROMAX Z-PAK) 250 MG tablet Take 1 tablet (250 mg total) by mouth daily. Take 2 tablets on day 1 and then 1 tablet on day 2 through 5. 02/28/21  Yes Margarette Canada, NP  calcium  carbonate (OS-CAL - DOSED IN MG OF ELEMENTAL CALCIUM) 1250 (500 Ca) MG tablet Take by mouth.   Yes [provider]  carvedilol (COREG) 3.125 MG tablet TAKE (1) TABLET BY MOUTH TWICE DAILY WITH MEALS 11/11/19  Yes [provider]  doxycycline (VIBRAMYCIN) 100 MG capsule Take 1 capsule (100 mg total) by mouth 2 (two) times daily. 02/28/21  Yes Margarette Canada, NP  DULoxetine (CYMBALTA) 30 MG capsule Take by mouth. 04/21/20  Yes [provider]  LORazepam (ATIVAN) 1 MG tablet Take by mouth. 06/15/20  Yes [provider]  metFORMIN (GLUCOPHAGE) 500 MG tablet Take by mouth. 07/04/19  Yes [provider]  Multiple Vitamin (MULTI-VITAMIN) tablet Take 1 tablet by mouth daily.   Yes [provider]  sacubitril-valsartan (ENTRESTO) 24-26 MG TAKE (1) TABLET BY MOUTH EVERY 12 HOURS 01/09/20  Yes [provider]    Family History History reviewed. No pertinent family history.  Social History Social History   Tobacco Use  . Smoking status: Never Smoker  . Smokeless tobacco: Never Used  Vaping Use  . Vaping Use: Never used     Allergies   Phenothiazines and Statins   Review of Systems Review of Systems  Constitutional: Negative for activity change, appetite change and  fever.  Skin: Positive for color change.  Hematological: Negative.   Psychiatric/Behavioral: Negative.      Physical Exam Triage Vital Signs ED Triage Vitals  Enc Vitals Group     BP 02/28/21 1538 118/76     Pulse Rate 02/28/21 1538 94     Resp 02/28/21 1538 18     Temp 02/28/21 1538 99 F (37.2 C)     Temp Source 02/28/21 1538 Oral     SpO2 02/28/21 1538 96 %     Weight 02/28/21 1535 210 lb (95.3 kg)     Height 02/28/21 1535 5\' 4"  (1.626 m)     Head Circumference --      Peak Flow --      Pain Score 02/28/21 1535 0     Pain Loc --      Pain Edu? --      Excl. in [place]? --    No data found.  Updated Vital Signs BP 118/76 (BP Location: Right Arm)   Pulse 94    Temp 99 F (37.2 C) (Oral)   Resp 18   Ht 5\' 4"  (1.626 m)   Wt 210 lb (95.3 kg)   SpO2 96%   BMI 36.05 kg/m   Visual Acuity Right Eye Distance:   Left Eye Distance:   Bilateral Distance:    Right Eye Near:   Left Eye Near:    Bilateral Near:     Physical Exam Vitals and nursing note reviewed.  Constitutional:      General: She is not in acute distress.    Appearance: Normal appearance. She is not ill-appearing.  HENT:     Head: Normocephalic and atraumatic.  Musculoskeletal:        General: Swelling and tenderness present. Normal range of motion.  Skin:    General: Skin is warm and dry.     Capillary Refill: Capillary refill takes less than 2 seconds.     Findings: Erythema present.  Neurological:     General: No focal deficit present.     Mental Status: She is alert and oriented to person, place, and time.  Psychiatric:        Mood and Affect: Mood normal.        Behavior: Behavior normal.        Thought Content: Thought content normal.        Judgment: Judgment normal.      UC Treatments / Results  Labs (all labs ordered are listed, but only abnormal results are displayed) Labs Reviewed - No data to display  EKG   Radiology No results found.  Procedures Procedures (including critical care time)  Medications Ordered in UC Medications - No data to display  Initial Impression / Assessment and Plan / UC Course  I have reviewed the triage vital signs and the nursing notes.  Pertinent labs & imaging results that were available during my care of the patient were reviewed by me and considered in my medical decision making (see chart for details).   Patient is a very pleasant 87 year old female with a history of lymphedema to her left arm that is here for evaluation of redness, pain, and swelling to the left arm that started last night.  Patient did suffer a cat scratch to her left thumb which is scabbed and well-healed.  That injury happened a week ago.   Patient's radial and ulnar pulses are 2+.  The skin is circumferentially erythematous, hot, and tender.  The redness extends to  just below the humeral head.  Patient's exam is consistent with cellulitis which is compounded by the lymphedema.  Will place patient on doxycycline plus azithromycin.     Final Clinical Impressions(s) / UC Diagnoses   Final diagnoses:  Cellulitis of left arm  Cat-scratch disease     Discharge Instructions     Take the azithromycin for treatment of the cat scratch disease.  Take 2 tablets on day 1 and then 1 tablet each day after that for total of 5 days treatment.  Take the doxycycline twice daily with food for 10 days for treatment of the cellulitis.  Take an over-the-counter probiotic 1 hour after each dose of antibiotic to help prevent diarrhea from forming as a result of the antibiotic therapy.  If you have an increase in your redness, increased pain, increase in swelling, fever, nausea or vomiting, dizziness, or fatigue return for reevaluation or go to the emergency department for evaluation.    ED Prescriptions    Medication Sig Dispense Auth. Provider   azithromycin (ZITHROMAX Z-PAK) 250 MG tablet Take 1 tablet (250 mg total) by mouth daily. Take 2 tablets on day 1 and then 1 tablet on day 2 through 5. 6 tablet Margarette Canada, NP   doxycycline (VIBRAMYCIN) 100 MG capsule Take 1 capsule (100 mg total) by mouth 2 (two) times daily. 20 capsule Margarette Canada, NP     PDMP not reviewed this encounter.   Margarette Canada, NP 02/28/21 1600

## 2021-10-11 ENCOUNTER — Other Ambulatory Visit: Payer: Self-pay

## 2021-10-11 ENCOUNTER — Ambulatory Visit (INDEPENDENT_AMBULATORY_CARE_PROVIDER_SITE_OTHER): Payer: Medicare Other | Admitting: Internal Medicine

## 2021-10-11 VITALS — BP 138/74 | HR 86 | Resp 18 | Ht 60.0 in | Wt 210.0 lb

## 2021-10-11 DIAGNOSIS — Z9989 Dependence on other enabling machines and devices: Secondary | ICD-10-CM

## 2021-10-11 DIAGNOSIS — Z7189 Other specified counseling: Secondary | ICD-10-CM

## 2021-10-11 DIAGNOSIS — I5022 Chronic systolic (congestive) heart failure: Secondary | ICD-10-CM

## 2021-10-11 DIAGNOSIS — G4733 Obstructive sleep apnea (adult) (pediatric): Secondary | ICD-10-CM

## 2021-10-11 NOTE — Patient Instructions (Signed)

## 2021-10-11 NOTE — Progress Notes (Signed)
[facility] [place], [place] [address]  Pulmonary Sleep Medicine   Office Visit Note  Patient Name: Natasha Young DOB: [birth year] MRN [id number]    Chief Complaint: Obstructive Sleep Apnea visit  Brief History:  Natasha Young is seen today for one year follow up The patient has a 3 year history of sleep apnea. Patient is using PAP nightly @ 10-18cmH2o.  The patient feels good after sleeping with PAP.  The patient reports benefiting from PAP use. Reported sleepiness is  resolved and the Epworth Sleepiness Score is 2 out of 24. The patient does not take naps. The patient complains of the following: whistling water chamber, heater runs out of water and mask leak when she lays on side and excess dry mouth..  The compliance download shows  compliance with an average use time of 6:20 hours @ 83%. The AHI is 4.3  The patient does not complain of limb movements disrupting sleep.  ROS  General: (-) fever, (-) chills, (-) night sweat Nose and Sinuses: (-) nasal stuffiness or itchiness, (-) postnasal drip, (-) nosebleeds, (-) sinus trouble. Mouth and Throat: (-) sore throat, (-) hoarseness. Neck: (-) swollen glands, (-) enlarged thyroid, (-) neck pain. Respiratory: - cough, - shortness of breath, - wheezing. Neurologic: - numbness, - tingling. Psychiatric: - anxiety, - depression   Current Medication: Outpatient Encounter Medications as of 10/11/2021  Medication Sig   metFORMIN (GLUCOPHAGE) 500 MG tablet Take 1 tablet by mouth daily with breakfast.   calcium carbonate (OS-CAL - DOSED IN MG OF ELEMENTAL CALCIUM) 1250 (500 Ca) MG tablet Take by mouth.   carvedilol (COREG) 3.125 MG tablet TAKE (1) TABLET BY MOUTH TWICE DAILY WITH MEALS   DULoxetine (CYMBALTA) 30 MG capsule Take by mouth.   LORazepam (ATIVAN) 1 MG tablet Take by mouth.   metFORMIN (GLUCOPHAGE) 500 MG tablet Take by mouth.   Multiple Vitamin (MULTI-VITAMIN) tablet Take 1 tablet by mouth daily.   Multiple  Vitamins-Minerals (ONE DAILY CALCIUM/IRON) TABS Take 1 tablet by mouth daily.   sacubitril-valsartan (ENTRESTO) 24-26 MG TAKE (1) TABLET BY MOUTH EVERY 12 HOURS   [DISCONTINUED] azithromycin (ZITHROMAX Z-PAK) 250 MG tablet Take 1 tablet (250 mg total) by mouth daily. Take 2 tablets on day 1 and then 1 tablet on day 2 through 5.   [DISCONTINUED] doxycycline (VIBRAMYCIN) 100 MG capsule Take 1 capsule (100 mg total) by mouth 2 (two) times daily.   No facility-administered encounter medications on file as of 10/11/2021.    Surgical History: Past Surgical History:  Procedure Laterality Date    R hipreplacement     CARPAL TUNNEL RELEASE Right    JOINT REPLACEMENT     MASTECTOMY MODIFIED RADICAL Bilateral    SIGMOIDECTOMY      Medical History: Past Medical History:  Diagnosis Date   Cancer ([place])    Chronic systolic HF (heart failure) (HCC)    Diabetes mellitus without complication (HCC)    Diverticulitis    HLD (hyperlipidemia)    HTN (hypertension)    Mitral insufficiency    OA (osteoarthritis)    PMR (polymyalgia rheumatica) (HCC)    T2DM (type 2 diabetes mellitus) ([place])     Family History: Non contributory to the present illness  Social History: Social History   Socioeconomic History   Marital status: Married    Spouse name: Not on file   Number of children: Not on file   Years of education: Not on file   Highest education level: Not on  file  Occupational History   Not on file  Tobacco Use   Smoking status: Never   Smokeless tobacco: Never  Vaping Use   Vaping Use: Never used  Substance and Sexual Activity   Alcohol use: Not on file   Drug use: Not on file   Sexual activity: Not on file  Other Topics Concern   Not on file  Social History Narrative   Not on file   Social Determinants of Health   Financial Resource Strain: Not on file  Food Insecurity: Not on file  Transportation Needs: Not on file  Physical Activity: Not on file  Stress: Not on file   Social Connections: Not on file  Intimate Partner Violence: Not on file    Vital Signs: Blood pressure 138/74, pulse 86, resp. rate 18, height 5' (1.524 m), weight 210 lb (95.3 kg), SpO2 98 %.  Examination: General Appearance: The patient is well-developed, well-nourished, and in no distress. Neck Circumference: 41 Skin: Gross inspection of skin unremarkable. Head: normocephalic, no gross deformities. Eyes: no gross deformities noted. ENT: ears appear grossly normal Neurologic: Alert and oriented. No involuntary movements.    EPWORTH SLEEPINESS SCALE:  Scale:  (0)= no chance of dozing; (1)= slight chance of dozing; (2)= moderate chance of dozing; (3)= high chance of dozing  Chance  Situtation    Sitting and reading: 0    Watching TV: 0    Sitting Inactive in public: 0    As a passenger in car: 0      Lying down to rest: 2    Sitting and talking: 0    Sitting quielty after lunch: 0    In a car, stopped in traffic: 0   TOTAL SCORE:   2 out of 24    SLEEP STUDIES:  PSG 04/16/18 AHI 54 SpO22min 79%   CPAP COMPLIANCE DATA:  Date Range: 10/06/2020 - -10/05/2021  Average Daily Use: 6:20 hours  Median Use: 7:12 hours  Compliance for > 4 Hours: 83% days  AHI: 4.3 respiratory events per hour  Days Used: 339/365  Mask Leak: 53.2  95th Percentile Pressure: 13.7 cmH2O         LABS: No results found for this or any previous visit (from the past 2160 hour(s)).  Radiology: No results found.  No results found.  No results found.    Assessment and Plan: Patient Active Problem List   Diagnosis Date Noted   OSA on CPAP 10/05/2020   CPAP use counseling 13/24/4010   Chronic systolic heart failure ([place]) 10/05/2020   Biventricular automatic implantable cardioverter defibrillator in situ 27/25/3664   Chronic systolic HF (heart failure) ([place]) 05/31/2019   Cardiomyopathy, nonischemic ([place]) 02/16/2016   Type 2 diabetes mellitus, controlled ([place])  05/27/2013   1. OSA on CPAP The patient does tolerate PAP and reports  benefit from PAP use. The patient was reminded how to clean equipment and advised to replace supplies routinely. She has a lot of leak and I have advised a chin strap. The patient was also counselled on weight loss. The compliance is good. The AHI is 4.3.   OSA- continue with good compliance with pap.. Use chin strap. F/u one year   2. CPAP use counseling CPAP Counseling: had a lengthy discussion with the patient regarding the importance of PAP therapy in management of the sleep apnea. Patient appears to understand the risk factor reduction and also understands the risks associated with untreated sleep apnea. Patient will try to make a good  faith effort to remain compliant with therapy. Also instructed the patient on proper cleaning of the device including the water must be changed daily if possible and use of distilled water is preferred. Patient understands that the machine should be regularly cleaned with appropriate recommended cleaning solutions that do not damage the PAP machine for example given white vinegar and water rinses. Other methods such as ozone treatment may not be as good as these simple methods to achieve cleaning.   3. Chronic systolic heart failure (HCC) Stable, on entresto, continue.     General Counseling: I have discussed the findings of the evaluation and examination with Izora Gala.  I have also discussed any further diagnostic evaluation thatmay be needed or ordered today. Krystyne verbalizes understanding of the findings of todays visit. We also reviewed her medications today and discussed drug interactions and side effects including but not limited excessive drowsiness and altered mental states. We also discussed that there is always a risk not just to her but also people around her. she has been encouraged to call the office with any questions or concerns that should arise related to todays visit.  No  orders of the defined types were placed in this encounter.       I have personally obtained a history, examined the patient, evaluated laboratory and imaging results, formulated the assessment and plan and placed orders. This patient was seen today by Tressie Ellis, PA-C in collaboration with Dr. Devona Konig.   Allyne Gee, MD [facility] Diplomate ABMS Pulmonary Critical Care Medicine and Sleep Medicine

## 2021-12-21 ENCOUNTER — Encounter: Payer: Self-pay | Admitting: Ophthalmology

## 2021-12-22 NOTE — Anesthesia Preprocedure Evaluation (Addendum)
Anesthesia Evaluation  Patient identified by MRN, date of birth, ID band Patient awake    Reviewed: Allergy & Precautions, H&P , NPO status , Patient's Chart, lab work & pertinent test results  Airway Mallampati: II  TM Distance: >3 FB Neck ROM: full    Dental no notable dental hx.    Pulmonary sleep apnea ,    Pulmonary exam normal breath sounds clear to auscultation       Cardiovascular hypertension, +CHF  Normal cardiovascular exam+ pacemaker  Rhythm:regular Rate:Normal  07/2021 Cardiology Clinic Visit Note:  Patient underwent a cardiac MRI. There is no gadolinium enhancement therefore no evidence of infarction. With adenosine stress there was no evidence of stress-induced ischemia. Ejection fraction was 22% by the MRI. 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 She underwent a repeat cardiac MRI which showed an EF 24% with no appreciable change. She underwent CRT/AICD device placement. She feels better since this is been placed. She an echocardiogram done recently showing an ejection fraction of 45% which is a marked improvement. Her biventricular resynchronization device is making a big difference both clinically and by echo. She had a cardiac MRI with stress showing no evidence of stress-induced ischemia proximally 1 year ago.   Neuro/Psych    GI/Hepatic   Endo/Other  diabetesoverweight  Renal/GU      Musculoskeletal   Abdominal   Peds  Hematology   Anesthesia Other Findings   Reproductive/Obstetrics                            Anesthesia Physical Anesthesia Plan  ASA: 3  Anesthesia Plan: MAC   Post-op Pain Management: Minimal or no pain anticipated   Induction:   PONV Risk Score and Plan: 2 and Treatment may vary due to age or medical condition, TIVA and Midazolam  Airway Management Planned:   Additional Equipment:   Intra-op  Plan:   Post-operative Plan:   Informed Consent: I have reviewed the patients History and Physical, chart, labs and discussed the procedure including the risks, benefits and alternatives for the proposed anesthesia with the patient or authorized representative who has indicated his/her understanding and acceptance.     Dental Advisory Given  Plan Discussed with: CRNA  Anesthesia Plan Comments:         Anesthesia Quick Evaluation

## 2021-12-31 NOTE — Discharge Instructions (Signed)

## 2022-01-03 ENCOUNTER — Ambulatory Visit
Admission: RE | Admit: 2022-01-03 | Discharge: 2022-01-03 | Disposition: A | Discharge status: Home / Self Care | Payer: Medicare Other | Attending: Ophthalmology | Admitting: Ophthalmology

## 2022-01-03 ENCOUNTER — Encounter: Payer: Self-pay | Admitting: Ophthalmology

## 2022-01-03 ENCOUNTER — Ambulatory Visit: Payer: Medicare Other | Admitting: Anesthesiology

## 2022-01-03 ENCOUNTER — Other Ambulatory Visit: Payer: Self-pay

## 2022-01-03 ENCOUNTER — Encounter
Admission: RE | Disposition: A | Discharge status: Home / Self Care | Payer: Self-pay | Source: Home / Self Care | Attending: Ophthalmology

## 2022-01-03 DIAGNOSIS — H2511 Age-related nuclear cataract, right eye: Secondary | ICD-10-CM | POA: Diagnosis present

## 2022-01-03 DIAGNOSIS — I509 Heart failure, unspecified: Secondary | ICD-10-CM | POA: Diagnosis not present

## 2022-01-03 DIAGNOSIS — I110 Hypertensive heart disease with heart failure: Secondary | ICD-10-CM | POA: Diagnosis not present

## 2022-01-03 DIAGNOSIS — E1136 Type 2 diabetes mellitus with diabetic cataract: Secondary | ICD-10-CM | POA: Insufficient documentation

## 2022-01-03 DIAGNOSIS — E669 Obesity, unspecified: Secondary | ICD-10-CM | POA: Insufficient documentation

## 2022-01-03 DIAGNOSIS — Z6835 Body mass index (BMI) 35.0-35.9, adult: Secondary | ICD-10-CM | POA: Diagnosis not present

## 2022-01-03 HISTORY — DX: Lymphedema, not elsewhere classified: I89.0

## 2022-01-03 HISTORY — DX: Sleep apnea, unspecified: G47.30

## 2022-01-03 HISTORY — PX: CATARACT EXTRACTION W/PHACO: SHX586

## 2022-01-03 LAB — GLUCOSE, CAPILLARY: Glucose-Capillary: 120 mg/dL — ABNORMAL HIGH (ref 70–99)

## 2022-01-03 SURGERY — PHACOEMULSIFICATION, CATARACT, WITH IOL INSERTION
Anesthesia: Monitor Anesthesia Care | Site: Eye | Laterality: Right

## 2022-01-03 MED ORDER — LACTATED RINGERS IV SOLN: INTRAVENOUS | Status: DC

## 2022-01-03 MED ORDER — MOXIFLOXACIN HCL 0.5 % OP SOLN
OPHTHALMIC | Status: DC | PRN
  Administered 2022-01-03: 0.2 mL via OPHTHALMIC

## 2022-01-03 MED ORDER — SIGHTPATH DOSE#1 BSS IO SOLN
INTRAOCULAR | Status: DC | PRN
  Administered 2022-01-03: 91 mL via OPHTHALMIC

## 2022-01-03 MED ORDER — MIDAZOLAM HCL 2 MG/2ML IJ SOLN
INTRAMUSCULAR | Status: DC | PRN
  Administered 2022-01-03: 1 mg via INTRAVENOUS

## 2022-01-03 MED ORDER — LIDOCAINE HCL (PF) 2 % IJ SOLN
INTRAOCULAR | Status: DC | PRN
  Administered 2022-01-03: 1 mL via INTRAOCULAR

## 2022-01-03 MED ORDER — FENTANYL CITRATE (PF) 100 MCG/2ML IJ SOLN
INTRAMUSCULAR | Status: DC | PRN
  Administered 2022-01-03: 50 ug via INTRAVENOUS

## 2022-01-03 MED ORDER — TETRACAINE HCL 0.5 % OP SOLN
1.0000 [drp] | OPHTHALMIC | Status: DC | PRN
  Administered 2022-01-03 (×3): 1 [drp] via OPHTHALMIC

## 2022-01-03 MED ORDER — ACETAMINOPHEN 160 MG/5ML PO SOLN: 325.0000 mg | Freq: Once | ORAL | Status: DC

## 2022-01-03 MED ORDER — SIGHTPATH DOSE#1 BSS IO SOLN
INTRAOCULAR | Status: DC | PRN
  Administered 2022-01-03: 15 mL

## 2022-01-03 MED ORDER — TRYPAN BLUE 0.06 % IO SOSY
PREFILLED_SYRINGE | INTRAOCULAR | Status: DC | PRN
  Administered 2022-01-03: 0.5 mL via INTRAOCULAR

## 2022-01-03 MED ORDER — ACETAMINOPHEN 325 MG PO TABS: 325.0000 mg | ORAL_TABLET | Freq: Once | ORAL | Status: DC

## 2022-01-03 MED ORDER — ARMC OPHTHALMIC DILATING DROPS
1.0000 "application " | OPHTHALMIC | Status: DC | PRN
  Administered 2022-01-03 (×3): 1 via OPHTHALMIC

## 2022-01-03 MED ORDER — SIGHTPATH DOSE#1 SODIUM HYALURONATE 23 MG/ML IO SOLUTION
PREFILLED_SYRINGE | INTRAOCULAR | Status: DC | PRN
  Administered 2022-01-03: 0.6 mL via INTRAOCULAR

## 2022-01-03 MED ORDER — SIGHTPATH DOSE#1 SODIUM HYALURONATE 10 MG/ML IO SOLUTION
PREFILLED_SYRINGE | INTRAOCULAR | Status: DC | PRN
  Administered 2022-01-03: 0.85 mL via INTRAOCULAR

## 2022-01-03 SURGICAL SUPPLY — 20 items
CANNULA ANT/CHMB 27G (MISCELLANEOUS) IMPLANT
CANNULA ANT/CHMB 27GA (MISCELLANEOUS) ×2 IMPLANT
CATARACT SUITE SIGHTPATH (MISCELLANEOUS) ×2 IMPLANT
DISSECTOR HYDRO NUCLEUS 50X22 (MISCELLANEOUS) ×2 IMPLANT
FEE CATARACT SUITE SIGHTPATH (MISCELLANEOUS) ×1 IMPLANT
GLOVE SURG GAMMEX PI TX LF 7.5 (GLOVE) ×2 IMPLANT
GLOVE SURG SYN 8.5  E (GLOVE) ×1
GLOVE SURG SYN 8.5 E (GLOVE) ×1 IMPLANT
GLOVE SURG SYN 8.5 PF PI (GLOVE) ×1 IMPLANT
LENS IOL TECNIS EYHANCE 25.5 (Intraocular Lens) ×1 IMPLANT
NDL FILTER BLUNT 18X1 1/2 (NEEDLE) ×1 IMPLANT
NEEDLE FILTER BLUNT 18X 1/2SAF (NEEDLE) ×1
NEEDLE FILTER BLUNT 18X1 1/2 (NEEDLE) ×1 IMPLANT
PACK VIT ANT 23G (MISCELLANEOUS) IMPLANT
RING MALYGIN (MISCELLANEOUS) IMPLANT
SUT ETHILON 10-0 CS-B-6CS-B-6 (SUTURE)
SUTURE EHLN 10-0 CS-B-6CS-B-6 (SUTURE) IMPLANT
SYR 3ML LL SCALE MARK (SYRINGE) ×2 IMPLANT
SYR 5ML LL (SYRINGE) ×2 IMPLANT
WATER STERILE IRR 250ML POUR (IV SOLUTION) ×2 IMPLANT

## 2022-01-03 NOTE — H&P (Signed)
[place]   Primary Care Physician:  Hortencia Pilar, MD Ophthalmologist: Dr. Benay Pillow  Pre-Procedure History & Physical: HPI:  Natasha Young is a 88 y.o. female here for cataract surgery.   Past Medical History:  Diagnosis Date   Cancer ([place]) 1987   Breast CA   Chronic systolic HF (heart failure) (HCC)    Diabetes mellitus without complication ([place])    Diverticulitis    HLD (hyperlipidemia)    HTN (hypertension)    Lymphedema    left upper arm   Mitral insufficiency    OA (osteoarthritis)    PMR (polymyalgia rheumatica) ([place])    Received treatment approx 2016-2018   Presence of permanent cardiac pacemaker 07/03/2019   Sleep apnea    A-PAP   T2DM (type 2 diabetes mellitus) ([place])     Past Surgical History:  Procedure Laterality Date    R hipreplacement     BREAST LUMPECTOMY  1987   CARPAL TUNNEL RELEASE Right    INSERT / REPLACE / REMOVE PACEMAKER  07/03/2019   JOINT REPLACEMENT     MASTECTOMY MODIFIED RADICAL Bilateral    SIGMOIDECTOMY      Prior to Admission medications   Medication Sig Start Date End Date Taking? Authorizing Provider  calcium carbonate (OS-CAL - DOSED IN MG OF ELEMENTAL CALCIUM) 1250 (500 Ca) MG tablet Take by mouth.   Yes [provider]  carvedilol (COREG) 3.125 MG tablet TAKE (1) TABLET BY MOUTH TWICE DAILY WITH MEALS 11/11/19  Yes [provider]  DULoxetine (CYMBALTA) 30 MG capsule Take by mouth. 04/21/20  Yes [provider]  LORazepam (ATIVAN) 1 MG tablet Take by mouth. 06/15/20  Yes [provider]  metFORMIN (GLUCOPHAGE) 500 MG tablet Take 1 tablet by mouth daily with breakfast. 06/16/21  Yes [provider]  Multiple Vitamins-Minerals (ONE DAILY CALCIUM/IRON) TABS Take 1 tablet by mouth daily.   Yes [provider]  sacubitril-valsartan (ENTRESTO) 24-26 MG TAKE (1) TABLET BY MOUTH EVERY 12 HOURS 01/09/20  Yes [provider]    Allergies as of 11/16/2021 - Review  Complete 02/28/2021  Allergen Reaction Noted   Phenothiazines Other (See Comments) 05/27/2013   Statins Other (See Comments) 01/09/2017    History reviewed. No pertinent family history.  Social History   Socioeconomic History   Marital status: Married    Spouse name: Not on file   Number of children: Not on file   Years of education: Not on file   Highest education level: Not on file  Occupational History   Not on file  Tobacco Use   Smoking status: Never   Smokeless tobacco: Never  Vaping Use   Vaping Use: Never used  Substance and Sexual Activity   Alcohol use: Not on file   Drug use: Not on file   Sexual activity: Not on file  Other Topics Concern   Not on file  Social History Narrative   Not on file   Social Determinants of Health   Financial Resource Strain: Not on file  Food Insecurity: Not on file  Transportation Needs: Not on file  Physical Activity: Not on file  Stress: Not on file  Social Connections: Not on file  Intimate Partner Violence: Not on file    Review of Systems: See HPI, otherwise negative ROS  Physical Exam: BP (!) 127/58    Pulse 87    Temp (!) 97.2 F (36.2 C) (Temporal)    Ht 5\' 4"  (1.626 m)  Wt 94.8 kg    SpO2 96%    BMI 35.89 kg/m  General:   Alert, cooperative in NAD Head:  Normocephalic and atraumatic. Respiratory:  Normal work of breathing. Cardiovascular:  RRR  Impression/Plan: Natasha Young is here for cataract surgery.  Risks, benefits, limitations, and alternatives regarding cataract surgery have been reviewed with the patient.  Questions have been answered.  All parties agreeable.   Benay Pillow, MD  01/03/2022, 11:59 AM

## 2022-01-03 NOTE — Op Note (Signed)
OPERATIVE NOTE  Natasha Young [id number] 01/03/2022   PREOPERATIVE DIAGNOSIS:  Nuclear sclerotic cataract right eye.  H25.11   POSTOPERATIVE DIAGNOSIS:    Nuclear sclerotic cataract right eye.     PROCEDURE:  Complex Phacoemusification with posterior chamber intraocular lens placement of the right eye, requiring trypan blue for visualization of the anterior capsule.  CPT [phone]  LENS:   Implant Name Type Inv. Item Serial No. Manufacturer Lot No. LRB No. Used Action  LENS IOL TECNIS EYHANCE 25.5 - [id number] Intraocular Lens LENS IOL TECNIS EYHANCE 25.5 [id number] [employer]  Right 1 Implanted       Procedure(s) with comments: CATARACT EXTRACTION PHACO AND INTRAOCULAR LENS PLACEMENT (IOC) RIGHT DIABETIC (Right) - 13.22 1:07.1  DIB00 +25.5   ULTRASOUND TIME: 1 minutes 07 seconds.  CDE 13.22   SURGEON:  Benay Pillow, MD, MPH  ANESTHESIOLOGIST: Anesthesiologist: Ronelle Nigh, MD CRNA: Cameron Ali, CRNA   ANESTHESIA:  Topical with tetracaine drops augmented with 1% preservative-free intracameral lidocaine.  ESTIMATED BLOOD LOSS: less than 1 mL.   COMPLICATIONS:  None.   DESCRIPTION OF PROCEDURE:  The patient was identified in the holding room and transported to the operating room and placed in the supine position under the operating microscope.  The right eye was identified as the operative eye and it was prepped and draped in the usual sterile ophthalmic fashion.   A 1.0 millimeter clear-corneal paracentesis was made at the 10:30 position. 0.5 ml of preservative-free 1% lidocaine with epinephrine was injected into the anterior chamber.  The lens was dense and the red reflex was poor, so trypan blue was used in the standard fashion to stain the anterior capsule.   The anterior chamber was filled with Healon 5 viscoelastic.  A 2.4 millimeter keratome was used to make a near-clear corneal incision at the 8:00 position.  A curvilinear capsulorrhexis was made with a cystotome and  capsulorrhexis forceps.  Balanced salt solution was used to hydrodissect and hydrodelineate the nucleus.   Phacoemulsification was then used in stop and chop fashion to remove the lens nucleus and epinucleus.  The remaining cortex was then removed using the irrigation and aspiration handpiece. Healon was then placed into the capsular bag to distend it for lens placement.  A lens was then injected into the capsular bag.  The remaining viscoelastic was aspirated.   Wounds were hydrated with balanced salt solution.  The anterior chamber was inflated to a physiologic pressure with balanced salt solution.   Intracameral vigamox 0.1 mL undiluted was injected into the eye and a drop placed onto the ocular surface.  No wound leaks were noted.  The patient was taken to the recovery room in stable condition without complications of anesthesia or surgery  Benay Pillow 01/03/2022, 12:33 PM

## 2022-01-03 NOTE — Anesthesia Procedure Notes (Signed)
Procedure Name: MAC Date/Time: 01/03/2022 12:11 PM Performed by: Cameron Ali, CRNA Pre-anesthesia Checklist: Patient identified, Emergency Drugs available, Suction available, Timeout performed and Patient being monitored Patient Re-evaluated:Patient Re-evaluated prior to induction Oxygen Delivery Method: Nasal cannula Placement Confirmation: positive ETCO2

## 2022-01-03 NOTE — Transfer of Care (Signed)
Immediate Anesthesia Transfer of Care Note  Patient: Natasha Young  Procedure(s) Performed: CATARACT EXTRACTION PHACO AND INTRAOCULAR LENS PLACEMENT (IOC) RIGHT DIABETIC (Right: Eye)  Patient Location: PACU  Anesthesia Type: MAC  Level of Consciousness: awake, alert  and patient cooperative  Airway and Oxygen Therapy: Patient Spontanous Breathing and Patient connected to supplemental oxygen  Post-op Assessment: Post-op Vital signs reviewed, Patient's Cardiovascular Status Stable, Respiratory Function Stable, Patent Airway and No signs of Nausea or vomiting  Post-op Vital Signs: Reviewed and stable  Complications: No notable events documented.

## 2022-01-03 NOTE — Anesthesia Postprocedure Evaluation (Signed)
Anesthesia Post Note  Patient: Natasha Young  Procedure(s) Performed: CATARACT EXTRACTION PHACO AND INTRAOCULAR LENS PLACEMENT (IOC) RIGHT DIABETIC (Right: Eye)     Patient location during evaluation: PACU Anesthesia Type: MAC Level of consciousness: awake and alert and oriented Pain management: satisfactory to patient Vital Signs Assessment: post-procedure vital signs reviewed and stable Respiratory status: spontaneous breathing, nonlabored ventilation and respiratory function stable Cardiovascular status: blood pressure returned to baseline and stable Postop Assessment: Adequate PO intake and No signs of nausea or vomiting Anesthetic complications: no   No notable events documented.  Raliegh Ip

## 2022-01-04 ENCOUNTER — Encounter: Payer: Self-pay | Admitting: Ophthalmology

## 2022-01-13 NOTE — Discharge Instructions (Signed)

## 2022-01-17 ENCOUNTER — Ambulatory Visit: Payer: Medicare Other | Admitting: Anesthesiology

## 2022-01-17 ENCOUNTER — Other Ambulatory Visit: Payer: Self-pay

## 2022-01-17 ENCOUNTER — Ambulatory Visit
Admission: RE | Admit: 2022-01-17 | Discharge: 2022-01-17 | Disposition: A | Discharge status: Home / Self Care | Payer: Medicare Other | Source: Ambulatory Visit | Attending: Ophthalmology | Admitting: Ophthalmology

## 2022-01-17 ENCOUNTER — Encounter
Admission: RE | Disposition: A | Discharge status: Home / Self Care | Payer: Self-pay | Source: Ambulatory Visit | Attending: Ophthalmology

## 2022-01-17 DIAGNOSIS — G4730 Sleep apnea, unspecified: Secondary | ICD-10-CM | POA: Insufficient documentation

## 2022-01-17 DIAGNOSIS — Z6836 Body mass index (BMI) 36.0-36.9, adult: Secondary | ICD-10-CM | POA: Diagnosis not present

## 2022-01-17 DIAGNOSIS — I5022 Chronic systolic (congestive) heart failure: Secondary | ICD-10-CM | POA: Insufficient documentation

## 2022-01-17 DIAGNOSIS — M1990 Unspecified osteoarthritis, unspecified site: Secondary | ICD-10-CM | POA: Insufficient documentation

## 2022-01-17 DIAGNOSIS — I110 Hypertensive heart disease with heart failure: Secondary | ICD-10-CM | POA: Insufficient documentation

## 2022-01-17 DIAGNOSIS — E1136 Type 2 diabetes mellitus with diabetic cataract: Secondary | ICD-10-CM | POA: Insufficient documentation

## 2022-01-17 DIAGNOSIS — H2512 Age-related nuclear cataract, left eye: Secondary | ICD-10-CM | POA: Diagnosis not present

## 2022-01-17 DIAGNOSIS — Z950 Presence of cardiac pacemaker: Secondary | ICD-10-CM | POA: Insufficient documentation

## 2022-01-17 DIAGNOSIS — E663 Overweight: Secondary | ICD-10-CM | POA: Insufficient documentation

## 2022-01-17 DIAGNOSIS — Z7984 Long term (current) use of oral hypoglycemic drugs: Secondary | ICD-10-CM | POA: Insufficient documentation

## 2022-01-17 DIAGNOSIS — Z853 Personal history of malignant neoplasm of breast: Secondary | ICD-10-CM | POA: Diagnosis not present

## 2022-01-17 HISTORY — PX: CATARACT EXTRACTION W/PHACO: SHX586

## 2022-01-17 LAB — GLUCOSE, CAPILLARY
Glucose-Capillary: 114 mg/dL — ABNORMAL HIGH (ref 70–99)
Glucose-Capillary: 125 mg/dL — ABNORMAL HIGH (ref 70–99)

## 2022-01-17 SURGERY — PHACOEMULSIFICATION, CATARACT, WITH IOL INSERTION
Anesthesia: Monitor Anesthesia Care | Site: Eye | Laterality: Left

## 2022-01-17 MED ORDER — ACETAMINOPHEN 325 MG PO TABS: 325.0000 mg | ORAL_TABLET | ORAL | Status: DC | PRN

## 2022-01-17 MED ORDER — SIGHTPATH DOSE#1 BSS IO SOLN
INTRAOCULAR | Status: DC | PRN
  Administered 2022-01-17: 15 mL

## 2022-01-17 MED ORDER — SIGHTPATH DOSE#1 BSS IO SOLN
INTRAOCULAR | Status: DC | PRN
  Administered 2022-01-17: 113 mL via OPHTHALMIC

## 2022-01-17 MED ORDER — MIDAZOLAM HCL 2 MG/2ML IJ SOLN
INTRAMUSCULAR | Status: DC | PRN
  Administered 2022-01-17: 1 mg via INTRAVENOUS

## 2022-01-17 MED ORDER — SIGHTPATH DOSE#1 SODIUM HYALURONATE 10 MG/ML IO SOLUTION
PREFILLED_SYRINGE | INTRAOCULAR | Status: DC | PRN
  Administered 2022-01-17: 0.85 mL via INTRAOCULAR

## 2022-01-17 MED ORDER — TETRACAINE HCL 0.5 % OP SOLN
1.0000 [drp] | OPHTHALMIC | Status: DC | PRN
  Administered 2022-01-17 (×2): 1 [drp] via OPHTHALMIC

## 2022-01-17 MED ORDER — ARMC OPHTHALMIC DILATING DROPS
1.0000 "application " | OPHTHALMIC | Status: DC | PRN
  Administered 2022-01-17 (×3): 1 via OPHTHALMIC

## 2022-01-17 MED ORDER — SIGHTPATH DOSE#1 SODIUM HYALURONATE 23 MG/ML IO SOLUTION
PREFILLED_SYRINGE | INTRAOCULAR | Status: DC | PRN
  Administered 2022-01-17: 0.6 mL via INTRAOCULAR

## 2022-01-17 MED ORDER — ACETAMINOPHEN 160 MG/5ML PO SOLN: 325.0000 mg | ORAL | Status: DC | PRN

## 2022-01-17 MED ORDER — FENTANYL CITRATE (PF) 100 MCG/2ML IJ SOLN
INTRAMUSCULAR | Status: DC | PRN
Start: 2022-01-17 — End: 2022-01-17
  Administered 2022-01-17: 25 ug via INTRAVENOUS
  Administered 2022-01-17: 50 ug via INTRAVENOUS

## 2022-01-17 MED ORDER — LIDOCAINE HCL (PF) 2 % IJ SOLN
INTRAOCULAR | Status: DC | PRN
  Administered 2022-01-17: 1 mL via INTRAOCULAR

## 2022-01-17 MED ORDER — ONDANSETRON HCL 4 MG/2ML IJ SOLN: 4.0000 mg | Freq: Once | INTRAMUSCULAR | Status: DC | PRN

## 2022-01-17 MED ORDER — MOXIFLOXACIN HCL 0.5 % OP SOLN
OPHTHALMIC | Status: DC | PRN
  Administered 2022-01-17: 0.2 mL via OPHTHALMIC

## 2022-01-17 SURGICAL SUPPLY — 20 items
CANNULA ANT/CHMB 27G (MISCELLANEOUS) IMPLANT
CANNULA ANT/CHMB 27GA (MISCELLANEOUS) IMPLANT
CATARACT SUITE SIGHTPATH (MISCELLANEOUS) ×2 IMPLANT
DISSECTOR HYDRO NUCLEUS 50X22 (MISCELLANEOUS) ×2 IMPLANT
FEE CATARACT SUITE SIGHTPATH (MISCELLANEOUS) ×1 IMPLANT
GLOVE SURG GAMMEX PI TX LF 7.5 (GLOVE) ×2 IMPLANT
GLOVE SURG SYN 8.5  E (GLOVE) ×1
GLOVE SURG SYN 8.5 E (GLOVE) ×1 IMPLANT
GLOVE SURG SYN 8.5 PF PI (GLOVE) ×1 IMPLANT
LENS IOL TECNIS EYHANCE 25.0 (Intraocular Lens) ×1 IMPLANT
NDL FILTER BLUNT 18X1 1/2 (NEEDLE) ×1 IMPLANT
NEEDLE FILTER BLUNT 18X 1/2SAF (NEEDLE) ×1
NEEDLE FILTER BLUNT 18X1 1/2 (NEEDLE) ×1 IMPLANT
PACK VIT ANT 23G (MISCELLANEOUS) IMPLANT
RING MALYGIN (MISCELLANEOUS) IMPLANT
SUT ETHILON 10-0 CS-B-6CS-B-6 (SUTURE)
SUTURE EHLN 10-0 CS-B-6CS-B-6 (SUTURE) IMPLANT
SYR 3ML LL SCALE MARK (SYRINGE) ×2 IMPLANT
SYR 5ML LL (SYRINGE) ×2 IMPLANT
WATER STERILE IRR 250ML POUR (IV SOLUTION) ×2 IMPLANT

## 2022-01-17 NOTE — Anesthesia Preprocedure Evaluation (Signed)
Anesthesia Evaluation  Patient identified by MRN, date of birth, ID band Patient awake    Reviewed: Allergy & Precautions, H&P , NPO status , Patient's Chart, lab work & pertinent test results  Airway Mallampati: II  TM Distance: >3 FB Neck ROM: full    Dental no notable dental hx.    Pulmonary sleep apnea ,    Pulmonary exam normal breath sounds clear to auscultation       Cardiovascular hypertension, +CHF  Normal cardiovascular exam+ pacemaker  Rhythm:regular Rate:Normal  07/2021 Cardiology Clinic Visit Note:  Patient underwent a cardiac MRI. There is no gadolinium enhancement therefore no evidence of infarction. With adenosine stress there was no evidence of stress-induced ischemia. Ejection fraction was 22% by the MRI. 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 She underwent a repeat cardiac MRI which showed an EF 24% with no appreciable change. She underwent CRT/AICD device placement. She feels better since this is been placed. She an echocardiogram done recently showing an ejection fraction of 45% which is a marked improvement. Her biventricular resynchronization device is making a big difference both clinically and by echo. She had a cardiac MRI with stress showing no evidence of stress-induced ischemia proximally 1 year ago.   Neuro/Psych    GI/Hepatic   Endo/Other  diabetes, Type 2overweight  Renal/GU      Musculoskeletal  (+) Arthritis ,   Abdominal   Peds  Hematology   Anesthesia Other Findings Hx breast cancer  Reproductive/Obstetrics                             Anesthesia Physical  Anesthesia Plan  ASA: 3  Anesthesia Plan: MAC   Post-op Pain Management: Minimal or no pain anticipated   Induction:   PONV Risk Score and Plan: 2 and Treatment may vary due to age or medical condition, TIVA and Midazolam  Airway Management  Planned: Nasal Cannula  Additional Equipment:   Intra-op Plan:   Post-operative Plan:   Informed Consent: I have reviewed the patients History and Physical, chart, labs and discussed the procedure including the risks, benefits and alternatives for the proposed anesthesia with the patient or authorized representative who has indicated his/her understanding and acceptance.     Dental Advisory Given  Plan Discussed with: CRNA  Anesthesia Plan Comments:         Anesthesia Quick Evaluation

## 2022-01-17 NOTE — Anesthesia Procedure Notes (Signed)
Procedure Name: MAC Date/Time: 01/17/2022 8:37 AM Performed by: Dionne Bucy, CRNA Pre-anesthesia Checklist: Patient identified, Emergency Drugs available, Suction available, Patient being monitored and Timeout performed Patient Re-evaluated:Patient Re-evaluated prior to induction Oxygen Delivery Method: Nasal cannula Placement Confirmation: positive ETCO2

## 2022-01-17 NOTE — Transfer of Care (Signed)
Immediate Anesthesia Transfer of Care Note  Patient: Natasha Young  Procedure(s) Performed: CATARACT EXTRACTION PHACO AND INTRAOCULAR LENS PLACEMENT (IOC) LEFT DIABETIC (Left: Eye)  Patient Location: PACU  Anesthesia Type: MAC  Level of Consciousness: awake, alert  and patient cooperative  Airway and Oxygen Therapy: Patient Spontanous Breathing and Patient connected to supplemental oxygen  Post-op Assessment: Post-op Vital signs reviewed, Patient's Cardiovascular Status Stable, Respiratory Function Stable, Patent Airway and No signs of Nausea or vomiting  Post-op Vital Signs: Reviewed and stable  Complications: No notable events documented.

## 2022-01-17 NOTE — Op Note (Signed)
OPERATIVE NOTE  Natasha Young [id number] 01/17/2022   PREOPERATIVE DIAGNOSIS:  Nuclear sclerotic cataract left eye.  H25.12   POSTOPERATIVE DIAGNOSIS:    Nuclear sclerotic cataract left eye.     PROCEDURE:  Phacoemusification with posterior chamber intraocular lens placement of the left eye   LENS:   Implant Name Type Inv. Item Serial No. Manufacturer Lot No. LRB No. Used Action  LENS IOL TECNIS EYHANCE 25.0 - [id number] Intraocular Lens LENS IOL TECNIS EYHANCE 25.0 [id number] [employer]  Left 1 Implanted      Procedure(s) with comments: CATARACT EXTRACTION PHACO AND INTRAOCULAR LENS PLACEMENT (IOC) LEFT DIABETIC (Left) - 16.53 1:19.9  DIB00 +25.0   ULTRASOUND TIME: 1 minutes 19 seconds.  CDE 16.53   SURGEON:  Benay Pillow, MD, MPH   ANESTHESIA:  Topical with tetracaine drops augmented with 1% preservative-free intracameral lidocaine.  ESTIMATED BLOOD LOSS: <1 mL   COMPLICATIONS:  None.   DESCRIPTION OF PROCEDURE:  The patient was identified in the holding room and transported to the operating room and placed in the supine position under the operating microscope.  The left eye was identified as the operative eye and it was prepped and draped in the usual sterile ophthalmic fashion.   A 1.0 millimeter clear-corneal paracentesis was made at the 5:00 position. 0.5 ml of preservative-free 1% lidocaine with epinephrine was injected into the anterior chamber.  The anterior chamber was filled with Healon 5 viscoelastic.  A 2.4 millimeter keratome was used to make a near-clear corneal incision at the 2:00 position.  A curvilinear capsulorrhexis was made with a cystotome and capsulorrhexis forceps.  Balanced salt solution was used to hydrodissect and hydrodelineate the nucleus.   Phacoemulsification was then used in stop and chop fashion to remove the lens nucleus and epinucleus.  The remaining cortex was then removed using the irrigation and aspiration handpiece. Healon was then placed  into the capsular bag to distend it for lens placement.  A lens was then injected into the capsular bag.  The remaining viscoelastic was aspirated.   Wounds were hydrated with balanced salt solution.  The anterior chamber was inflated to a physiologic pressure with balanced salt solution.  Intracameral vigamox 0.1 mL undiltued was injected into the eye and a drop placed onto the ocular surface.  No wound leaks were noted.  The patient was taken to the recovery room in stable condition without complications of anesthesia or surgery  Benay Pillow 01/17/2022, 9:00 AM

## 2022-01-17 NOTE — Anesthesia Postprocedure Evaluation (Signed)
Anesthesia Post Note  Patient: Natasha Young  Procedure(s) Performed: CATARACT EXTRACTION PHACO AND INTRAOCULAR LENS PLACEMENT (I[facility]) LEFT DIABETIC (Left: Eye)     Patient location during evaluation: PACU Anesthesia Type: MAC Level of consciousness: awake Pain management: pain level controlled Vital Signs Assessment: post-procedure vital signs reviewed and stable Respiratory status: respiratory function stable Cardiovascular status: stable Postop Assessment: no apparent nausea or vomiting Anesthetic complications: no   No notable events documented.  Veda Canning

## 2022-01-17 NOTE — H&P (Signed)
[place]   Primary Care Physician:  Hortencia Pilar, MD Ophthalmologist: Dr. Benay Pillow  Pre-Procedure History & Physical: HPI:  Natasha Young is a 88 y.o. female here for cataract surgery.   Past Medical History:  Diagnosis Date   Cancer ([place]) 1987   Breast CA   Chronic systolic HF (heart failure) (HCC)    Diabetes mellitus without complication (HCC)    Diverticulitis    HLD (hyperlipidemia)    HTN (hypertension)    Lymphedema    left upper arm   Mitral insufficiency    OA (osteoarthritis)    PMR (polymyalgia rheumatica) (HCC)    Received treatment approx 2016-2018   Presence of permanent cardiac pacemaker 07/03/2019   Sleep apnea    A-PAP   T2DM (type 2 diabetes mellitus) ([place])     Past Surgical History:  Procedure Laterality Date    R hipreplacement     BREAST LUMPECTOMY  1987   CARPAL TUNNEL RELEASE Right    CATARACT EXTRACTION W/PHACO Right 01/03/2022   Procedure: CATARACT EXTRACTION PHACO AND INTRAOCULAR LENS PLACEMENT ([facility]) RIGHT DIABETIC;  Surgeon: Eulogio Bear, MD;  Location: [facility];  Service: Ophthalmology;  Laterality: Right;  13.22 1:07.1   INSERT / REPLACE / REMOVE PACEMAKER  07/03/2019   JOINT REPLACEMENT     MASTECTOMY MODIFIED RADICAL Bilateral    SIGMOIDECTOMY      Prior to Admission medications   Medication Sig Start Date End Date Taking? Authorizing Provider  calcium carbonate (OS-CAL - DOSED IN MG OF ELEMENTAL CALCIUM) 1250 (500 Ca) MG tablet Take by mouth.   Yes [provider]  carvedilol (COREG) 3.125 MG tablet TAKE (1) TABLET BY MOUTH TWICE DAILY WITH MEALS 11/11/19  Yes [provider]  DULoxetine (CYMBALTA) 30 MG capsule Take by mouth. 04/21/20  Yes [provider]  LORazepam (ATIVAN) 1 MG tablet Take by mouth. 06/15/20  Yes [provider]  metFORMIN (GLUCOPHAGE) 500 MG tablet Take 1 tablet by mouth daily with breakfast. 06/16/21  Yes [provider]  Multiple  Vitamins-Minerals (ONE DAILY CALCIUM/IRON) TABS Take 1 tablet by mouth daily.   Yes [provider]  sacubitril-valsartan (ENTRESTO) 24-26 MG TAKE (1) TABLET BY MOUTH EVERY 12 HOURS 01/09/20  Yes [provider]    Allergies as of 11/16/2021 - Review Complete 02/28/2021  Allergen Reaction Noted   Phenothiazines Other (See Comments) 05/27/2013   Statins Other (See Comments) 01/09/2017    History reviewed. No pertinent family history.  Social History   Socioeconomic History   Marital status: Married    Spouse name: Not on file   Number of children: Not on file   Years of education: Not on file   Highest education level: Not on file  Occupational History   Not on file  Tobacco Use   Smoking status: Never   Smokeless tobacco: Never  Vaping Use   Vaping Use: Never used  Substance and Sexual Activity   Alcohol use: Not on file   Drug use: Not on file   Sexual activity: Not on file  Other Topics Concern   Not on file  Social History Narrative   Not on file   Social Determinants of Health   Financial Resource Strain: Not on file  Food Insecurity: Not on file  Transportation Needs: Not on file  Physical Activity: Not on file  Stress: Not on file  Social Connections: Not on file  Intimate Partner Violence: Not on file  Review of Systems: See HPI, otherwise negative ROS  Physical Exam: BP 139/72    Pulse 88    Temp 98.2 F (36.8 C) (Temporal)    Resp 14    Ht 5\' 4"  (1.626 m)    Wt 96.6 kg    SpO2 96%    BMI 36.56 kg/m  General:   Alert, cooperative in NAD Head:  Normocephalic and atraumatic. Respiratory:  Normal work of breathing. Cardiovascular:  RRR  Impression/Plan: Natasha Young is here for cataract surgery.  Risks, benefits, limitations, and alternatives regarding cataract surgery have been reviewed with the patient.  Questions have been answered.  All parties agreeable.   Benay Pillow, MD  01/17/2022, 8:26 AM

## 2022-01-18 ENCOUNTER — Encounter: Payer: Self-pay | Admitting: Ophthalmology

## 2022-07-21 DIAGNOSIS — T82198A Other mechanical complication of other cardiac electronic device, initial encounter: Secondary | ICD-10-CM | POA: Insufficient documentation

## 2023-04-03 ENCOUNTER — Ambulatory Visit (INDEPENDENT_AMBULATORY_CARE_PROVIDER_SITE_OTHER): Payer: Medicare Other | Admitting: Internal Medicine

## 2023-04-03 VITALS — BP 101/63 | HR 75 | Resp 14 | Ht 63.0 in | Wt 187.0 lb

## 2023-04-03 DIAGNOSIS — G4733 Obstructive sleep apnea (adult) (pediatric): Secondary | ICD-10-CM | POA: Diagnosis not present

## 2023-04-03 DIAGNOSIS — Z7189 Other specified counseling: Secondary | ICD-10-CM | POA: Diagnosis not present

## 2023-04-03 DIAGNOSIS — I5022 Chronic systolic (congestive) heart failure: Secondary | ICD-10-CM

## 2023-04-03 NOTE — Progress Notes (Signed)
[facility] [address] [place], [place] [address]  Pulmonary Sleep Medicine   Office Visit Note  Patient Name: Natasha Young DOB: 11-Feb-1933 MRN [id number]    Chief Complaint: Obstructive Sleep Apnea visit  Brief History:  Aliyyah is seen today for an annual follow up on APAP at 10-18 cmh20.  The patient has a 4 year history of sleep apnea. Patient is using PAP nightly.  The patient feels rested after sleeping with PAP.  The patient reports benefiting from PAP use. Reported sleepiness is  improved and the Epworth Sleepiness Score is 2 out of 24. The patient rarely take naps. The patient complains of the following: Some dryness, her water chamber is running out of water during the night.  The compliance download shows 83% compliance with an average use time of 6:33 hours. The AHI is 3.9.  The patient does not complain of limb movements disrupting sleep.  ROS  General: (-) fever, (-) chills, (-) night sweat Nose and Sinuses: (-) nasal stuffiness or itchiness, (-) postnasal drip, (-) nosebleeds, (-) sinus trouble. Mouth and Throat: (-) sore throat, (-) hoarseness. Neck: (-) swollen glands, (-) enlarged thyroid, (-) neck pain. Respiratory: - cough, - shortness of breath, - wheezing. Neurologic: - numbness, - tingling. Psychiatric: - anxiety, - depression   Current Medication: Outpatient Encounter Medications as of 04/03/2023  Medication Sig   OZEMPIC, 0.25 OR 0.5 MG/DOSE, 2 MG/3ML SOPN Inject into the skin.   calcium carbonate (OS-CAL - DOSED IN MG OF ELEMENTAL CALCIUM) 1250 (500 Ca) MG tablet Take by mouth.   carvedilol (COREG) 3.125 MG tablet TAKE (1) TABLET BY MOUTH TWICE DAILY WITH MEALS   DULoxetine (CYMBALTA) 30 MG capsule Take by mouth.   LORazepam (ATIVAN) 1 MG tablet Take by mouth.   metFORMIN (GLUCOPHAGE) 500 MG tablet Take 1 tablet by mouth daily with breakfast.   Multiple Vitamins-Minerals (ONE DAILY CALCIUM/IRON) TABS Take 1 tablet by mouth daily.    sacubitril-valsartan (ENTRESTO) 24-26 MG TAKE (1) TABLET BY MOUTH EVERY 12 HOURS   No facility-administered encounter medications on file as of 04/03/2023.    Surgical History: Past Surgical History:  Procedure Laterality Date    R hipreplacement     BREAST LUMPECTOMY  1987   CARPAL TUNNEL RELEASE Right    CATARACT EXTRACTION W/PHACO Right 01/03/2022   Procedure: CATARACT EXTRACTION PHACO AND INTRAOCULAR LENS PLACEMENT ([facility]) RIGHT DIABETIC;  Surgeon: Nevada Crane, MD;  Location[facility];  Service: Ophthalmology;  Laterality: Right;  13.22 1:07.1   CATARACT EXTRACTION W/PHACO Left 01/17/2022   Procedure: CATARACT EXTRACTION PHACO AND INTRAOCULAR LENS PLACEMENT ([facility]) LEFT DIABETIC;  Surgeon: Nevada Crane, MD;  Location: [facility];  Service: Ophthalmology;  Laterality: Left;  16.53 1:19.9   INSERT / REPLACE / REMOVE PACEMAKER  07/03/2019   JOINT REPLACEMENT     MASTECTOMY MODIFIED RADICAL Bilateral    SIGMOIDECTOMY      Medical History: Past Medical History:  Diagnosis Date   Cancer ([facility]) 1987   Breast CA   Chronic systolic HF (heart failure) (HCC)    Diabetes mellitus without complication (HCC)    Diverticulitis    HLD (hyperlipidemia)    HTN (hypertension)    Lymphedema    left upper arm   Mitral insufficiency    OA (osteoarthritis)    PMR (polymyalgia rheumatica) (HCC)    Received treatment approx 2016-2018   Presence of permanent cardiac pacemaker 07/03/2019   Sleep apnea    A-PAP  T2DM (type 2 diabetes mellitus) (HCC)     Family History: Non contributory to the present illness  Social History: Social History   Socioeconomic History   Marital status: Married    Spouse name: Not on file   Number of children: Not on file   Years of education: Not on file   Highest education level: Not on file  Occupational History   Not on file  Tobacco Use   Smoking status: Never   Smokeless tobacco: Never  Vaping Use   Vaping Use: Never  used  Substance and Sexual Activity   Alcohol use: Not on file   Drug use: Not on file   Sexual activity: Not on file  Other Topics Concern   Not on file  Social History Narrative   Not on file   Social Determinants of Health   Financial Resource Strain: Not on file  Food Insecurity: Not on file  Transportation Needs: Not on file  Physical Activity: Not on file  Stress: Not on file  Social Connections: Not on file  Intimate Partner Violence: Not on file    Vital Signs: Blood pressure 101/63, pulse 75, resp. rate 14, height 5\' 3"  (1.6 m), weight 187 lb (84.8 kg), SpO2 95 %. Body mass index is 33.13 kg/m.    Examination: General Appearance: The patient is well-developed, well-nourished, and in no distress. Neck Circumference: 39 cm Skin: Gross inspection of skin unremarkable. Head: normocephalic, no gross deformities. Eyes: no gross deformities noted. ENT: ears appear grossly normal Neurologic: Alert and oriented. No involuntary movements.  STOP BANG RISK ASSESSMENT S (snore) Have you been told that you snore?     NO   T (tired) Are you often tired, fatigued, or sleepy during the day?   NO  O (obstruction) Do you stop breathing, choke, or gasp during sleep? NO   P (pressure) Do you have or are you being treated for high blood pressure? NO   B (BMI) Is your body index greater than 35 kg/m? NO   A (age) Are you 14 years old or older? YES   N (neck) Do you have a neck circumference greater than 16 inches?   NO   G (gender) Are you a female? NO   TOTAL STOP/BANG "YES" ANSWERS 1       A STOP-Bang score of 2 or less is considered low risk, and a score of 5 or more is high risk for having either moderate or severe OSA. For people who score 3 or 4, doctors may need to perform further assessment to determine how likely they are to have OSA.         EPWORTH SLEEPINESS SCALE:  Scale:  (0)= no chance of dozing; (1)= slight chance of dozing; (2)= moderate chance of  dozing; (3)= high chance of dozing  Chance  Situtation    Sitting and reading: 0    Watching TV: 0    Sitting Inactive in public: 0    As a passenger in car: 0      Lying down to rest: 1    Sitting and talking: 0    Sitting quielty after lunch: 1    In a car, stopped in traffic: 0   TOTAL SCORE:   2 out of 24    SLEEP STUDIES:  PSG (04/16/18) AHI 54, min SPO2 79%   CPAP COMPLIANCE DATA:  Date Range: 03/30/22 - 03/29/23  Average Daily Use: 6:33 hours  Median Use: 7:52 hours  Compliance  for > 4 Hours: 302 days  AHI: 3.9 respiratory events per hour  Days Used: 319/365  Mask Leak: 29  95th Percentile Pressure: 15.2 cmh20         LABS: No results found for this or any previous visit (from the past 2160 hour(s)).  Radiology: No results found.  No results found.  No results found.    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 (CRT-D) 07/21/2022   OSA on CPAP 10/05/2020   CPAP use counseling 10/05/2020   Chronic systolic heart failure (HCC) 10/05/2020   Colovesical fistula 03/10/2020   Biventricular automatic implantable cardioverter defibrillator in situ 07/04/2019   Chronic systolic HF (heart failure) (HCC) 05/31/2019   PMR (polymyalgia rheumatica) (HCC) 07/08/2016   History of right hip replacement 04/27/2016   Primary osteoarthritis of both hips 04/27/2016   Cardiomyopathy, nonischemic (HCC) 02/16/2016   Joint stiffness 02/08/2016   LBBB (left bundle branch block) 06/24/2014   Mitral insufficiency 07/03/2013   Type 2 diabetes mellitus, controlled (HCC) 05/27/2013   History of breast cancer 05/27/2013   Diverticula of colon 05/27/2013   Lymphedema 05/27/2013   Osteoarthritis 05/27/2013   Pure hypercholesterolemia 05/27/2013   1. OSA on CPAP The patient does tolerate PAP and reports  benefit from PAP use. The patient was reminded how to  clean equipment and advised to replace supplies routinely. The patient was also counselled on weight loss. The compliance is good. The AHI is 3.9.   OSA on cpap- controlled. Continue with excellent compliance with pap. CPAP continues to be medically necessary to treat this patient's OSA. F/u one year.     2. CPAP use counseling CPAP Counseling: had a lengthy discussion with the patient regarding the importance of PAP therapy in management of the sleep apnea. Patient appears to understand the risk factor reduction and also understands the risks associated with untreated sleep apnea. Patient will try to make a good faith effort to remain compliant with therapy. Also instructed the patient on proper cleaning of the device including the water must be changed daily if possible and use of distilled water is preferred. Patient understands that the machine should be regularly cleaned with appropriate recommended cleaning solutions that do not damage the PAP machine for example given white vinegar and water rinses. Other methods such as ozone treatment may not be as good as these simple methods to achieve cleaning.   3. Chronic systolic heart failure (HCC) Stable. F/u with cardiology.     General Counseling: I have discussed the findings of the evaluation and examination with Harriett Sine.  I have also discussed any further diagnostic evaluation thatmay be needed or ordered today. Jenisha verbalizes understanding of the findings of todays visit. We also reviewed her medications today and discussed drug interactions and side effects including but not limited excessive drowsiness and altered mental states. We also discussed that there is always a risk not just to her but also people around her. she has been encouraged to call the office with any questions or concerns that should arise related to todays visit.  No orders of the defined types were placed in this encounter.       I have personally obtained a history,  examined the patient, evaluated laboratory and imaging results, formulated the assessment and plan and placed orders. This patient was seen today by Emmaline Kluver, PA-C in collaboration with Dr. Freda Munro.   Yevonne Pax, MD  FCCP Diplomate ABMS Pulmonary Critical Care Medicine and Sleep Medicine

## 2023-04-03 NOTE — Patient Instructions (Signed)

## 2023-05-11 ENCOUNTER — Other Ambulatory Visit: Payer: Self-pay | Admitting: Orthopedic Surgery

## 2023-05-11 DIAGNOSIS — G8929 Other chronic pain: Secondary | ICD-10-CM

## 2023-05-11 DIAGNOSIS — M5136 Other intervertebral disc degeneration, lumbar region: Secondary | ICD-10-CM

## 2023-05-23 ENCOUNTER — Ambulatory Visit
Admission: RE | Admit: 2023-05-23 | Discharge: 2023-05-23 | Disposition: A | Discharge status: Home / Self Care | Payer: Medicare Other | Source: Ambulatory Visit | Attending: Orthopedic Surgery | Admitting: Orthopedic Surgery

## 2023-05-23 DIAGNOSIS — M5441 Lumbago with sciatica, right side: Secondary | ICD-10-CM | POA: Diagnosis present

## 2023-05-23 DIAGNOSIS — M5136 Other intervertebral disc degeneration, lumbar region: Secondary | ICD-10-CM | POA: Insufficient documentation

## 2023-05-23 DIAGNOSIS — G8929 Other chronic pain: Secondary | ICD-10-CM | POA: Insufficient documentation

## 2023-09-11 ENCOUNTER — Other Ambulatory Visit: Payer: Self-pay | Admitting: Family Medicine

## 2023-09-11 DIAGNOSIS — Z780 Asymptomatic menopausal state: Secondary | ICD-10-CM

## 2023-10-03 ENCOUNTER — Ambulatory Visit
Admission: RE | Admit: 2023-10-03 | Discharge: 2023-10-03 | Disposition: A | Discharge status: Home / Self Care | Payer: Medicare Other | Source: Ambulatory Visit | Attending: Family Medicine | Admitting: Family Medicine

## 2023-10-03 DIAGNOSIS — Z780 Asymptomatic menopausal state: Secondary | ICD-10-CM | POA: Diagnosis present

## 2023-10-06 NOTE — Progress Notes (Unsigned)
[facility] [address] [place], [place] [address]  Pulmonary Sleep Medicine   Office Visit Note  Patient Name: Natasha Young DOB: 1933/01/10 MRN [id number]    Chief Complaint: Obstructive Sleep Apnea visit  Brief History:  Natasha Young is seen today for a follow up visit for APAP@ 10-18 cmH2O. The patient has a 5 year history of sleep apnea. Patient is using PAP nightly.  The patient feels rested after sleeping with PAP.  The patient reports benefiting from PAP use. Reported sleepiness is  improved and the Epworth Sleepiness Score is 2 out of 24. The patient does not usually take naps. The patient complains of the following: mask leak and oral dryness. We will schedule a mask fitting.  The compliance download shows 72% compliance with an average use time of 7 hours. The AHI is 3.8.  The patient does not complain of limb movements disrupting sleep. The patient continues to require PAP therapy in order to eliminate sleep apnea.   ROS  General: (-) fever, (-) chills, (-) night sweat Nose and Sinuses: (-) nasal stuffiness or itchiness, (-) postnasal drip, (-) nosebleeds, (-) sinus trouble. Mouth and Throat: (-) sore throat, (-) hoarseness. Neck: (-) swollen glands, (-) enlarged thyroid, (-) neck pain. Respiratory: - cough, - shortness of breath, - wheezing. Neurologic: - numbness, - tingling. Psychiatric: - anxiety, - depression   Current Medication: Outpatient Encounter Medications as of 10/09/2023  Medication Sig   NEXLETOL 180 MG TABS Take 1 tablet by mouth daily.   calcium carbonate (OS-CAL - DOSED IN MG OF ELEMENTAL CALCIUM) 1250 (500 Ca) MG tablet Take by mouth.   carvedilol (COREG) 3.125 MG tablet TAKE (1) TABLET BY MOUTH TWICE DAILY WITH MEALS   DULoxetine (CYMBALTA) 30 MG capsule Take by mouth.   LORazepam (ATIVAN) 1 MG tablet Take by mouth.   metFORMIN (GLUCOPHAGE) 500 MG tablet Take 1 tablet by mouth daily with breakfast.   Multiple Vitamins-Minerals (ONE DAILY  CALCIUM/IRON) TABS Take 1 tablet by mouth daily.   OZEMPIC, 0.25 OR 0.5 MG/DOSE, 2 MG/3ML SOPN Inject into the skin.   [DISCONTINUED] sacubitril-valsartan (ENTRESTO) 24-26 MG TAKE (1) TABLET BY MOUTH EVERY 12 HOURS   No facility-administered encounter medications on file as of 10/09/2023.    Surgical History: Past Surgical History:  Procedure Laterality Date    R hipreplacement     BREAST LUMPECTOMY  1987   CARPAL TUNNEL RELEASE Right    CATARACT EXTRACTION W/PHACO Right 01/03/2022   Procedure: CATARACT EXTRACTION PHACO AND INTRAOCULAR LENS PLACEMENT (IOC) RIGHT DIABETIC;  Surgeon: Nevada Crane, MD;  Location: [facility];  Service: Ophthalmology;  Laterality: Right;  13.22 1:07.1   CATARACT EXTRACTION W/PHACO Left 01/17/2022   Procedure: CATARACT EXTRACTION PHACO AND INTRAOCULAR LENS PLACEMENT (IOC) LEFT DIABETIC;  Surgeon: Nevada Crane, MD;  Location: [facility];  Service: Ophthalmology;  Laterality: Left;  16.53 1:19.9   INSERT / REPLACE / REMOVE PACEMAKER  07/03/2019   JOINT REPLACEMENT     MASTECTOMY MODIFIED RADICAL Bilateral    SIGMOIDECTOMY      Medical History: Past Medical History:  Diagnosis Date   Cancer (HCC) 1987   Breast CA   Chronic systolic HF (heart failure) (HCC)    Diabetes mellitus without complication (HCC)    Diverticulitis    HLD (hyperlipidemia)    HTN (hypertension)    Lymphedema    left upper arm   Mitral insufficiency    OA (osteoarthritis)    PMR (polymyalgia rheumatica) (  HCC)    Received treatment approx 2016-2018   Presence of permanent cardiac pacemaker 07/03/2019   Sleep apnea    A-PAP   T2DM (type 2 diabetes mellitus) (HCC)     Family History: Non contributory to the present illness  Social History: Social History   Socioeconomic History   Marital status: Single    Spouse name: Not on file   Number of children: Not on file   Years of education: Not on file   Highest education level: Not on file   Occupational History   Not on file  Tobacco Use   Smoking status: Never   Smokeless tobacco: Never  Vaping Use   Vaping status: Never Used  Substance and Sexual Activity   Alcohol use: Not on file   Drug use: Not on file   Sexual activity: Not on file  Other Topics Concern   Not on file  Social History Narrative   Not on file   Social Determinants of Health   Financial Resource Strain: Medium Risk (08/03/2023)   Received from [facility]   Overall Financial Resource Strain (CARDIA)    Difficulty of Paying Living Expenses: Somewhat hard  Food Insecurity: No Food Insecurity (08/03/2023)   Received from [facility]   Hunger Vital Sign    Worried About Running Out of Food in the Last Year: Never true    Ran Out of Food in the Last Year: Never true  Transportation Needs: No Transportation Needs (08/03/2023)   Received from [facility]    In the past 12 months, has lack of transportation kept you from medical appointments or from getting medications?: No    Lack of Transportation (Non-Medical): No  Physical Activity: Sufficiently Active (08/14/2023)   Received from [facility]   Exercise Vital Sign    Days of Exercise per Week: 7 days    Minutes of Exercise per Session: 40 min  Stress: Not on file  Social Connections: Moderately Isolated (08/14/2023)   Received from [facility]   Social Connection and Isolation Panel [NHANES]    Frequency of Communication with Friends and Family: Three times a week    Frequency of Social Gatherings with Friends and Family: More than three times a week    Attends Religious Services: More than 4 times per year    Active Member of Clubs or Organizations: No    Attends Banker Meetings: Never    Marital Status: Widowed  Intimate Partner Violence: Not on file    Vital Signs: Blood pressure 134/73, pulse 91, resp. rate  20, height 5\' 3"  (1.6 m), weight 187 lb (84.8 kg), SpO2 97%. Body mass index is 33.13 kg/m.    Examination: General Appearance: The patient is well-developed, well-nourished, and in no distress. Neck Circumference: 39 cm Skin: Gross inspection of skin unremarkable. Head: normocephalic, no gross deformities. Eyes: no gross deformities noted. ENT: ears appear grossly normal Neurologic: Alert and oriented. No involuntary movements.  STOP BANG RISK ASSESSMENT S (snore) Have you been told that you snore?     No   T (tired) Are you often tired, fatigued, or sleepy during the day?   NO  O (obstruction) Do you stop breathing, choke, or gasp during sleep? NO   P (pressure) Do you have or are you being treated for high blood pressure? NO   B (BMI) Is your body index greater than 35 kg/m?  NO   A (age) Are you 42 years old or older? YES   N (neck) Do you have a neck circumference greater than 16 inches?   NO   G (gender) Are you a female? NO   TOTAL STOP/BANG "YES" ANSWERS 1       A STOP-Bang score of 2 or less is considered low risk, and a score of 5 or more is high risk for having either moderate or severe OSA. For people who score 3 or 4, doctors may need to perform further assessment to determine how likely they are to have OSA.         EPWORTH SLEEPINESS SCALE:  Scale:  (0)= no chance of dozing; (1)= slight chance of dozing; (2)= moderate chance of dozing; (3)= high chance of dozing  Chance  Situtation    Sitting and reading: 0    Watching TV: 0    Sitting Inactive in public: 0    As a passenger in car: 0      Lying down to rest: 1    Sitting and talking: 0    Sitting quielty after lunch: 1    In a car, stopped in traffic: 0   TOTAL SCORE:   2 out of 24    SLEEP STUDIES:  PSG (04/2018) AHI 54/hr, min SpO2 79% Titration (04/2018) 1 month trial of APAP@ 6-18 cmH2O. If central events persist, recommend BiPAP ASV titration.    CPAP COMPLIANCE DATA:  Date  Range: 07/08/2023-10/05/2023  Average Daily Use: 7 hours  Median Use: 7 hours 29 minutes  Compliance for > 4 Hours: 72%  AHI: 3.8 respiratory events per hour  Days Used: 73/90 days  Mask Leak: 37.4  95th Percentile Pressure: 13.9         LABS: No results found for this or any previous visit (from the past 2160 hour(s)).  Radiology: DG BONE DENSITY (DXA)  Result Date: 10/03/2023 EXAM: DUAL X-RAY ABSORPTIOMETRY (DXA) FOR BONE MINERAL DENSITY IMPRESSION: Your patient Natasha Young completed a BMD test on 10/03/2023 using the [employer] Advance DXA System (analysis version: 14.10) manufactured by [employer]. The following summarizes the results of our evaluation. Technologist: LCE PATIENT BIOGRAPHICAL: Name: Natasha Young, Natasha Young Patient ID: [id number] Birth Date: [birth year] Height: 63.0 in. Gender: Female Exam Date: 10/03/2023 Weight: 189.0 lbs. Indications: Advanced Age, Caucasian, Diabetes, Early Menopause, high risk meds, hx breast ca, hx steroid use, Hysterectomy, Oophorectomy Bilateral, polymyalgia rheumatica, POSTmenopausal, rt hip replacement, Sleep Apnea Fractures: Treatments: calcium /vit d, Metformin, Multivitamin, Ozempic Injection, Vitamin D ASSESSMENT: The BMD measured at Femur Neck is 0.977 g/cm2 with a T-score of -0.4. This patient is considered normal according to World Health Organization [facility]) criteria. L-3 and L-4 were excluded due to degenerative changes/compression fracture, etc. The scan quality is good. Site       Region Measured   Measured WHO            Young Adult BMD Date       Age      Classification T-score AP Spine L1-L2 10/03/2023 88.3 Normal 2.1 1.419 g/cm2 AP Spine L1-L2 11/03/2020 85.3 Normal 2.3 1.438 g/cm2 AP Spine L1-L2 05/17/2018 82.9 Normal 1.7 1.370 g/cm2 AP Spine L1-L2 02/15/2016 80.6 Normal 2.0 1.406 g/cm2 AP Spine L1-L2 11/20/2014 79.4 Normal 1.6 1.375 g/cm2 Left Femur Neck 10/03/2023 88.3 Normal -0.4 0.977 g/cm2 Left Femur Neck 11/03/2020 85.3  Normal -0.5 0.967 g/cm2 Left Femur Neck 05/17/2018 82.9 Normal -0.2 1.005 g/cm2 Left Femur Neck 02/15/2016 80.6 Normal -  0.3 0.993 g/cm2 Left Femur Neck 11/20/2014 79.4 Normal -0.6 0.956 g/cm2 Left Femur Total 10/03/2023 88.3 Normal -0.2 0.987 g/cm2 Left Femur Total 11/03/2020 85.3 Normal 0.0 1.008 g/cm2 Left Femur Total 05/17/2018 82.9 Normal 0.3 1.040 g/cm2 Left Femur Total 02/15/2016 80.6 Normal 0.4 1.063 g/cm2 Left Femur Total 11/20/2014 79.4 Normal 0.3 1.043 g/cm2 World Health Organization [facility]) criteria for post-menopausal, Caucasian Women: Normal:       T-score at or above -1 SD Osteopenia:   T-score between -1 and -2.5 SD Osteoporosis: T-score at or below -2.5 SD RECOMMENDATIONS: 1. All patients should optimize calcium and vitamin D intake. 2. Consider FDA-approved medical therapies in postmenopausal women and men aged 52 years and older, based on the following: a. A hip or vertebral (clinical or morphometric) fracture b. T-score < -2.5 at the femoral neck or spine after appropriate evaluation to exclude secondary causes c. Low bone mass (T-score between -1.0 and -2.5 at the femoral neck or spine) and a 10-year probability of a hip fracture > 3% or a 10-year probability of a major osteoporosis-related fracture > 20% based on the [place]-adapted WHO algorithm d. Clinician judgment and/or patient preferences may indicate treatment for people with 10-year fracture probabilities above or below these levels FOLLOW-UP: People with diagnosed cases of osteoporosis or at high risk for fracture should have regular bone mineral density tests. For patients eligible for Medicare, routine testing is allowed once every 2 years. The testing frequency can be increased to one year for patients who have rapidly progressing disease, those who are receiving or discontinuing medical therapy to restore bone mass, or have additional risk factors. I have reviewed this report, and agree with the above findings. [facility]  Electronically Signed   By: Romona Curls M.D.   On: 10/03/2023 14:49    No results found.  DG BONE DENSITY (DXA)  Result Date: 10/03/2023 EXAM: DUAL X-RAY ABSORPTIOMETRY (DXA) FOR BONE MINERAL DENSITY IMPRESSION: Your patient Natasha Young completed a BMD test on 10/03/2023 using the [employer] Advance DXA System (analysis version: 14.10) manufactured by [employer]. The following summarizes the results of our evaluation. Technologist: LCE PATIENT BIOGRAPHICAL: Name: Natasha Young, Natasha Young Patient ID: [id number] Birth Date: Apr 12, 1933 Height: 63.0 in. Gender: Female Exam Date: 10/03/2023 Weight: 189.0 lbs. Indications: Advanced Age, Caucasian, Diabetes, Early Menopause, high risk meds, hx breast ca, hx steroid use, Hysterectomy, Oophorectomy Bilateral, polymyalgia rheumatica, POSTmenopausal, rt hip replacement, Sleep Apnea Fractures: Treatments: calcium /vit d, Metformin, Multivitamin, Ozempic Injection, Vitamin D ASSESSMENT: The BMD measured at Femur Neck is 0.977 g/cm2 with a T-score of -0.4. This patient is considered normal according to World Health Organization [facility]) criteria. L-3 and L-4 were excluded due to degenerative changes/compression fracture, etc. The scan quality is good. Site       Region Measured   Measured WHO            Young Adult BMD Date       Age      Classification T-score AP Spine L1-L2 10/03/2023 88.3 Normal 2.1 1.419 g/cm2 AP Spine L1-L2 11/03/2020 85.3 Normal 2.3 1.438 g/cm2 AP Spine L1-L2 05/17/2018 82.9 Normal 1.7 1.370 g/cm2 AP Spine L1-L2 02/15/2016 80.6 Normal 2.0 1.406 g/cm2 AP Spine L1-L2 11/20/2014 79.4 Normal 1.6 1.375 g/cm2 Left Femur Neck 10/03/2023 88.3 Normal -0.4 0.977 g/cm2 Left Femur Neck 11/03/2020 85.3 Normal -0.5 0.967 g/cm2 Left Femur Neck 05/17/2018 82.9 Normal -0.2 1.005 g/cm2 Left Femur Neck 02/15/2016 80.6 Normal -0.3 0.993 g/cm2 Left Femur Neck 11/20/2014 79.4 Normal -0.6  0.956 g/cm2 Left Femur Total 10/03/2023 88.3 Normal -0.2 0.987 g/cm2 Left Femur Total  11/03/2020 85.3 Normal 0.0 1.008 g/cm2 Left Femur Total 05/17/2018 82.9 Normal 0.3 1.040 g/cm2 Left Femur Total 02/15/2016 80.6 Normal 0.4 1.063 g/cm2 Left Femur Total 11/20/2014 79.4 Normal 0.3 1.043 g/cm2 World Health Organization [facility]) criteria for post-menopausal, Caucasian Women: Normal:       T-score at or above -1 SD Osteopenia:   T-score between -1 and -2.5 SD Osteoporosis: T-score at or below -2.5 SD RECOMMENDATIONS: 1. All patients should optimize calcium and vitamin D intake. 2. Consider FDA-approved medical therapies in postmenopausal women and men aged 69 years and older, based on the following: a. A hip or vertebral (clinical or morphometric) fracture b. T-score < -2.5 at the femoral neck or spine after appropriate evaluation to exclude secondary causes c. Low bone mass (T-score between -1.0 and -2.5 at the femoral neck or spine) and a 10-year probability of a hip fracture > 3% or a 10-year probability of a major osteoporosis-related fracture > 20% based on the [place]-adapted WHO algorithm d. Clinician judgment and/or patient preferences may indicate treatment for people with 10-year fracture probabilities above or below these levels FOLLOW-UP: People with diagnosed cases of osteoporosis or at high risk for fracture should have regular bone mineral density tests. For patients eligible for Medicare, routine testing is allowed once every 2 years. The testing frequency can be increased to one year for patients who have rapidly progressing disease, those who are receiving or discontinuing medical therapy to restore bone mass, or have additional risk factors. I have reviewed this report, and agree with the above findings. [facility] Electronically Signed   By: Romona Curls M.D.   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   Biventricular automatic implantable cardioverter defibrillator in situ 07/04/2019   Chronic systolic HF (heart failure) (HCC) 05/31/2019   PMR (polymyalgia rheumatica) (HCC) 07/08/2016   History of right hip replacement 04/27/2016   Primary osteoarthritis of both hips 04/27/2016   Cardiomyopathy, nonischemic (HCC) 02/16/2016   Joint stiffness 02/08/2016   LBBB (left bundle branch block) 06/24/2014   Mitral insufficiency 07/03/2013   Type 2 diabetes mellitus, controlled (HCC) 05/27/2013   History of breast cancer 05/27/2013   Diverticula of colon 05/27/2013   Lymphedema 05/27/2013   Osteoarthritis 05/27/2013   Pure hypercholesterolemia 05/27/2013   Thyroid adenoma 05/27/2013   1. OSA on CPAP The patient does tolerate PAP and reports  benefit from PAP use. She has excess mask leak and a mask fitting will be scheduled. The patient was reminded how to clean equipment and advised to replace supplies routinely. The patient was also counselled on weight loss. The compliance is fair. The AHI is 3.8.   OSA on cpap- controlled. Continue with excellent compliance with pap. CPAP continues to be medically necessary to treat this patient's OSA. F/u one year.    2. CPAP use counseling CPAP Counseling: had a lengthy discussion with the patient regarding the importance of PAP therapy in management of the sleep apnea. Patient appears to understand the risk factor reduction and also understands the risks associated with untreated sleep apnea. Patient will try to make a good faith effort to remain compliant  with therapy. Also instructed the patient on proper cleaning of the device including the water must be changed daily if possible and use of distilled water is preferred. Patient understands that the machine should be regularly cleaned with appropriate recommended cleaning solutions that do not  damage the PAP machine for example given white vinegar and water rinses. Other methods such as ozone treatment may not be as good as these simple methods to achieve cleaning.     General Counseling: I have discussed the findings of the evaluation and examination with Natasha Young.  I have also discussed any further diagnostic evaluation thatmay be needed or ordered today. Natasha Young verbalizes understanding of the findings of todays visit. We also reviewed her medications today and discussed drug interactions and side effects including but not limited excessive drowsiness and altered mental states. We also discussed that there is always a risk not just to her but also people around her. she has been encouraged to call the office with any questions or concerns that should arise related to todays visit.  No orders of the defined types were placed in this encounter.       I have personally obtained a history, examined the patient, evaluated laboratory and imaging results, formulated the assessment and plan and placed orders. This patient was seen today by Emmaline Kluver, PA-C in collaboration with Dr. Freda Munro.   Yevonne Pax, MD [facility] Diplomate ABMS Pulmonary Critical Care Medicine and Sleep Medicine

## 2023-10-09 ENCOUNTER — Ambulatory Visit (INDEPENDENT_AMBULATORY_CARE_PROVIDER_SITE_OTHER): Payer: Medicare Other | Admitting: Internal Medicine

## 2023-10-09 VITALS — BP 134/73 | HR 91 | Resp 20 | Ht 63.0 in | Wt 187.0 lb

## 2023-10-09 DIAGNOSIS — G4733 Obstructive sleep apnea (adult) (pediatric): Secondary | ICD-10-CM | POA: Diagnosis not present

## 2023-10-09 DIAGNOSIS — Z7189 Other specified counseling: Secondary | ICD-10-CM

## 2023-10-09 NOTE — Patient Instructions (Signed)

## 2024-01-17 ENCOUNTER — Other Ambulatory Visit: Payer: Self-pay | Admitting: Medical Genetics

## 2024-07-07 ENCOUNTER — Ambulatory Visit: Payer: Self-pay | Admitting: Emergency Medicine

## 2024-07-07 ENCOUNTER — Encounter: Payer: Self-pay | Admitting: Emergency Medicine

## 2024-07-07 ENCOUNTER — Ambulatory Visit
Admission: EM | Admit: 2024-07-07 | Discharge: 2024-07-07 | Disposition: A | Discharge status: Home / Self Care | Attending: Emergency Medicine | Admitting: Emergency Medicine

## 2024-07-07 DIAGNOSIS — I890 Lymphedema, not elsewhere classified: Secondary | ICD-10-CM

## 2024-07-07 DIAGNOSIS — L03114 Cellulitis of left upper limb: Secondary | ICD-10-CM

## 2024-07-07 LAB — BASIC METABOLIC PANEL WITH GFR
Anion gap: 10 (ref 5–15)
BUN: 20 mg/dL (ref 8–23)
CO2: 24 mmol/L (ref 22–32)
Calcium: 9.1 mg/dL (ref 8.9–10.3)
Chloride: 102 mmol/L (ref 98–111)
Creatinine, Ser: 1 mg/dL (ref 0.44–1.00)
GFR, Estimated: 54 mL/min — ABNORMAL LOW (ref >60)
Glucose, Bld: 102 mg/dL — ABNORMAL HIGH (ref 70–99)
Potassium: 4.7 mmol/L (ref 3.5–5.1)
Sodium: 136 mmol/L (ref 135–145)

## 2024-07-07 MED ORDER — AMOXICILLIN 875 MG PO TABS: 875.0000 mg | ORAL_TABLET | Freq: Two times a day (BID) | ORAL | 0 refills | Status: AC

## 2024-07-07 MED ORDER — DOXYCYCLINE HYCLATE 100 MG PO CAPS: 100.0000 mg | ORAL_CAPSULE | Freq: Two times a day (BID) | ORAL | 0 refills | Status: AC

## 2024-07-07 MED ORDER — CEFTRIAXONE SODIUM 1 G IJ SOLR
1.0000 g | Freq: Once | INTRAMUSCULAR | Status: AC
  Administered 2024-07-07: 1 g via INTRAMUSCULAR

## 2024-07-07 NOTE — ED Triage Notes (Signed)
 Pt c/o cellulitis on her left arm. She states she has lymphedema in her left arm. Started this morning. She has redness on the upper arm and forearm that is warm to the touch.

## 2024-07-07 NOTE — ED Provider Notes (Signed)
 HPI  SUBJECTIVE:  Natasha Young is a 89 y.o. female who presents with the acute onset of diffuse left arm erythema, worsening edema, increased temperature starting this morning.  It has not spread since it started this morning.  She denies pain.,  Bodies, fevers, trauma, coughing, wheezing, chest pain, shortness of breath, recent blood pressure measurements or blood draws.  She states this is identical to previous episodes of cellulitis in this arm which she has had 6 times.  No aggravating or alleviating factors.  She has not tried anything for this. She has a past medical history of breast cancer status post lumpectomy/mastectomy, left upper arm lymphedema, hyperlipidemia, hypertension, diabetes, CHF, diverticulitis, status post pacemaker placement, polymyalgia rheumatica.  No history of PAD/PVD, DVT/PE, chronic kidney disease, MRSA.  PCP: [facility].  Past Medical History:  Diagnosis Date   Cancer (HCC) 1987   Breast CA   Chronic systolic HF (heart failure) (HCC)    Diabetes mellitus without complication (HCC)    Diverticulitis    HLD (hyperlipidemia)    HTN (hypertension)    Lymphedema    left upper arm   Mitral insufficiency    OA (osteoarthritis)    PMR (polymyalgia rheumatica) (HCC)    Received treatment approx 2016-2018   Presence of permanent cardiac pacemaker 07/03/2019   Sleep apnea    A-PAP   T2DM (type 2 diabetes mellitus) (HCC)     Past Surgical History:  Procedure Laterality Date    R hipreplacement     BREAST LUMPECTOMY  1987   CARPAL TUNNEL RELEASE Right    CATARACT EXTRACTION W/PHACO Right 01/03/2022   Procedure: CATARACT EXTRACTION PHACO AND INTRAOCULAR LENS PLACEMENT (IOC) RIGHT DIABETIC;  Surgeon: Myrna Adine Anes, MD;  Location: [facility];  Service: Ophthalmology;  Laterality: Right;  13.22 1:07.1   CATARACT EXTRACTION W/PHACO Left 01/17/2022   Procedure: CATARACT EXTRACTION PHACO AND INTRAOCULAR LENS PLACEMENT (IOC) LEFT DIABETIC;   Surgeon: Myrna Adine Anes, MD;  Location: [facility];  Service: Ophthalmology;  Laterality: Left;  16.53 1:19.9   INSERT / REPLACE / REMOVE PACEMAKER  07/03/2019   JOINT REPLACEMENT     MASTECTOMY MODIFIED RADICAL Bilateral    SIGMOIDECTOMY      History reviewed. No pertinent family history.  Social History   Tobacco Use   Smoking status: Never   Smokeless tobacco: Never  Vaping Use   Vaping status: Never Used  Substance Use Topics   Alcohol use: Never   Drug use: Never    No current facility-administered medications for this encounter.  Current Outpatient Medications:    amoxicillin  (AMOXIL ) 875 MG tablet, Take 1 tablet (875 mg total) by mouth 2 (two) times daily for 7 days., Disp: 14 tablet, Rfl: 0   calcium carbonate (OS-CAL - DOSED IN MG OF ELEMENTAL CALCIUM) 1250 (500 Ca) MG tablet, Take by mouth., Disp: , Rfl:    carvedilol (COREG) 3.125 MG tablet, TAKE (1) TABLET BY MOUTH TWICE DAILY WITH MEALS, Disp: , Rfl:    doxycycline  (VIBRAMYCIN ) 100 MG capsule, Take 1 capsule (100 mg total) by mouth 2 (two) times daily for 7 days., Disp: 14 capsule, Rfl: 0   DULoxetine (CYMBALTA) 30 MG capsule, Take by mouth., Disp: , Rfl:    LORazepam (ATIVAN) 1 MG tablet, Take by mouth., Disp: , Rfl:    metFORMIN (GLUCOPHAGE) 500 MG tablet, Take 1 tablet by mouth daily with breakfast., Disp: , Rfl:    Multiple Vitamins-Minerals (ONE DAILY CALCIUM/IRON) TABS, Take 1  tablet by mouth daily., Disp: , Rfl:    OZEMPIC, 0.25 OR 0.5 MG/DOSE, 2 MG/3ML SOPN, Inject into the skin., Disp: , Rfl:    NEXLETOL 180 MG TABS, Take 1 tablet by mouth daily., Disp: , Rfl:   Allergies  Allergen Reactions   Compazine [Prochlorperazine]     Lock jaw   Phenothiazines Other (See Comments)    LOCK JAW    Statins Other (See Comments)    Intolerance to statin drugs     ROS  As noted in HPI.   Physical Exam  BP 111/73 (BP Location: Right Arm)   Pulse 91   Temp 98.4 F (36.9 C) (Oral)   Resp 16    Ht 5' 3 (1.6 m)   Wt 84.8 kg   SpO2 93%   BMI 33.12 kg/m   Constitutional: Well developed, well nourished, no acute distress Eyes:  EOMI, conjunctiva normal bilaterally HENT: Normocephalic, atraumatic,mucus membranes moist Respiratory: Normal inspiratory effort Cardiovascular: Normal rate, regular rhythm, no murmurs rubs or gallops GI: nondistended skin: Nontender blanchable erythema starting mid left humerus extending to her wrist.  Positive increased temperature.  Skin intact.  No apparent induration.  Marked area of erythema with marker for reference.     Musculoskeletal: Sensation and motor intact in median/radial/ulnar distribution.  RP 2+. Neurologic: Alert & oriented x 3, no focal neuro deficits Psychiatric: Speech and behavior appropriate   ED Course   Medications  cefTRIAXone  (ROCEPHIN ) injection 1 g (1 g Intramuscular Given 07/07/24 1604)    Orders Placed This Encounter  Procedures   Basic metabolic panel    Standing Status:   Standing    Number of Occurrences:   1    Results for orders placed or performed during the hospital encounter of 07/07/24 (from the past 24 hours)  Basic metabolic panel     Status: Abnormal   Collection Time: 07/07/24  3:59 PM  Result Value Ref Range   Sodium 136 135 - 145 mmol/L   Potassium 4.7 3.5 - 5.1 mmol/L   Chloride 102 98 - 111 mmol/L   CO2 24 22 - 32 mmol/L   Glucose, Bld 102 (H) 70 - 99 mg/dL   BUN 20 8 - 23 mg/dL   Creatinine, Ser 8.99 0.44 - 1.00 mg/dL   Calcium 9.1 8.9 - 89.6 mg/dL   GFR, Estimated 54 (L) >60 mL/min   Anion gap 10 5 - 15   No results found.  ED Clinical Impression  1. Cellulitis of left arm   2. Lymphedema of left arm      ED Assessment/Plan    Patient presents with acute onset cellulitis of her arm.  Doubt DVT.  She states this is identical to the past 6 episodes of cellulitis that she has had in this arm.  It is fairly extensive, so we will give her 1 g of Rocephin .  Last labs I can find  are from 2024, so we will check a basic metabolic panel for kidney function.  Will contact patient [phone] if we need to change dosing of her medications.  Will send home with amoxicillin  875 mg and doxycycline  100 mg twice daily for 7 days for cellulitis.  She will follow-up with her PCP if not improving.  She will go to ER if she gets worse.  Calculated creatinine clearance from labs done today 51 mL/min.  Do not need to renally dose the antibiotics.  Discussed labs, MDM, treatment plan, and plan for follow-up with patient. Discussed  sn/sx that should prompt return to the ED. patient agrees with plan.   Meds ordered this encounter  Medications   cefTRIAXone  (ROCEPHIN ) injection 1 g   amoxicillin  (AMOXIL ) 875 MG tablet    Sig: Take 1 tablet (875 mg total) by mouth 2 (two) times daily for 7 days.    Dispense:  14 tablet    Refill:  0   doxycycline  (VIBRAMYCIN ) 100 MG capsule    Sig: Take 1 capsule (100 mg total) by mouth 2 (two) times daily for 7 days.    Dispense:  14 capsule    Refill:  0      *This clinic note was created using Scientist, clinical (histocompatibility and immunogenetics). Therefore, there may be occasional mistakes despite careful proofreading.  ?    Van Knee, MD 07/07/24 [phone]

## 2024-07-07 NOTE — Discharge Instructions (Addendum)
 I have given you 1000 mg of Rocephin  here.  Finish the doxycycline  and amoxicillin , even if you feel better.  1000 mg of Tylenol  3 times a day as needed for pain if you start to have pain.  Please follow-up with your primary care provider if not getting better in several days.  Go to the ER if you get worse to have chest pain, shortness of breath, fevers, or for other concerns  I am also checking your kidney function.  I will contact you if and only if I need to change the dosing of your medications.

## 2024-08-02 ENCOUNTER — Other Ambulatory Visit: Payer: Self-pay | Admitting: Medical Genetics

## 2024-08-02 DIAGNOSIS — Z006 Encounter for examination for normal comparison and control in clinical research program: Secondary | ICD-10-CM

## 2024-08-16 LAB — GENECONNECT MOLECULAR SCREEN: Genetic Analysis Overall Interpretation: NEGATIVE

## 2024-10-11 NOTE — Progress Notes (Signed)
 [facility] [address] [place], [place] [address]  Pulmonary Sleep Medicine   Office Visit Note  Patient Name: Natasha Young DOB: Aug 18, 1933 MRN [id number]    Chief Complaint: Obstructive Sleep Apnea visit  Brief History:  Natasha Young is seen today for an annual follow up visit for APAP@ 10-18 cmH2O. The patient has a 6 year history of sleep apnea. Patient is using PAP nightly.  The patient feels rested after sleeping with PAP.  The patient reports benefiting from PAP use. Reported sleepiness is  improved and the Epworth Sleepiness Score is 3 out of 24. The patient will take 20 min naps 5 days out of the week. The patient complains of the following: went back to Airtouch F20 M mask, hybrid mask had problems staying in place. Oral dryness.  The compliance download shows 77% compliance with an average use time of 6 hours 42 minutes. The AHI is 5.2.  The patient does not complain of limb movements disrupting sleep. The patient continues to require PAP therapy in order to eliminate sleep apnea.   ROS  General: (-) fever, (-) chills, (-) night sweat Nose and Sinuses: (-) nasal stuffiness or itchiness, (-) postnasal drip, (-) nosebleeds, (-) sinus trouble. Mouth and Throat: (-) sore throat, (-) hoarseness. Neck: (-) swollen glands, (-) enlarged thyroid, (-) neck pain. Respiratory: - cough, - shortness of breath, - wheezing. Neurologic: - numbness, - tingling. Psychiatric: + anxiety, - depression   Current Medication: Outpatient Encounter Medications as of 10/14/2024  Medication Sig   calcium carbonate (OS-CAL - DOSED IN MG OF ELEMENTAL CALCIUM) 1250 (500 Ca) MG tablet Take by mouth.   carvedilol (COREG) 3.125 MG tablet TAKE (1) TABLET BY MOUTH TWICE DAILY WITH MEALS   DULoxetine (CYMBALTA) 30 MG capsule Take by mouth.   LORazepam (ATIVAN) 1 MG tablet Take by mouth.   metFORMIN (GLUCOPHAGE) 500 MG tablet Take 1 tablet by mouth daily with breakfast.   Multiple  Vitamins-Minerals (ONE DAILY CALCIUM/IRON) TABS Take 1 tablet by mouth daily.   NEXLETOL 180 MG TABS Take 1 tablet by mouth daily.   OZEMPIC, 0.25 OR 0.5 MG/DOSE, 2 MG/3ML SOPN Inject into the skin.   No facility-administered encounter medications on file as of 10/14/2024.    Surgical History: Past Surgical History:  Procedure Laterality Date    R hipreplacement     BREAST LUMPECTOMY  1987   CARPAL TUNNEL RELEASE Right    CATARACT EXTRACTION W/PHACO Right 01/03/2022   Procedure: CATARACT EXTRACTION PHACO AND INTRAOCULAR LENS PLACEMENT (IOC) RIGHT DIABETIC;  Surgeon: Myrna Adine Anes, MD;  Location: [facility];  Service: Ophthalmology;  Laterality: Right;  13.22 1:07.1   CATARACT EXTRACTION W/PHACO Left 01/17/2022   Procedure: CATARACT EXTRACTION PHACO AND INTRAOCULAR LENS PLACEMENT (IOC) LEFT DIABETIC;  Surgeon: Myrna Adine Anes, MD;  Location: [facility];  Service: Ophthalmology;  Laterality: Left;  16.53 1:19.9   INSERT / REPLACE / REMOVE PACEMAKER  07/03/2019   JOINT REPLACEMENT     MASTECTOMY MODIFIED RADICAL Bilateral    SIGMOIDECTOMY      Medical History: Past Medical History:  Diagnosis Date   Cancer (HCC) 1987   Breast CA   Chronic systolic HF (heart failure) (HCC)    Diabetes mellitus without complication (HCC)    Diverticulitis    HLD (hyperlipidemia)    HTN (hypertension)    Lymphedema    left upper arm   Mitral insufficiency    OA (osteoarthritis)    PMR (polymyalgia rheumatica)  Received treatment approx 2016-2018   Presence of permanent cardiac pacemaker 07/03/2019   Sleep apnea    A-PAP   T2DM (type 2 diabetes mellitus) (HCC)     Family History: Non contributory to the present illness  Social History: Social History   Socioeconomic History   Marital status: Single    Spouse name: Not on file   Number of children: Not on file   Years of education: Not on file   Highest education level: Not on file  Occupational History    Not on file  Tobacco Use   Smoking status: Never   Smokeless tobacco: Never  Vaping Use   Vaping status: Never Used  Substance and Sexual Activity   Alcohol use: Never   Drug use: Never   Sexual activity: Not on file  Other Topics Concern   Not on file  Social History Narrative   Not on file   Social Drivers of Health   Financial Resource Strain: Low Risk  (08/12/2024)   Received from [facility]   Overall Financial Resource Strain (CARDIA)    Difficulty of Paying Living Expenses: Not very hard  Food Insecurity: No Food Insecurity (08/12/2024)   Received from [facility]   Hunger Vital Sign    Within the past 12 months, you worried that your food would run out before you got the money to buy more.: Never true    Within the past 12 months, the food you bought just didn't last and you didn't have money to get more.: Never true  Transportation Needs: No Transportation Needs (08/12/2024)   Received from [facility] - Transportation    In the past 12 months, has lack of transportation kept you from medical appointments or from getting medications?: No    Lack of Transportation (Non-Medical): No  Physical Activity: Sufficiently Active (08/14/2023)   Received from [facility]   Exercise Vital Sign    On average, how many days per week do you engage in moderate to strenuous exercise (like a brisk walk)?: 7 days    On average, how many minutes do you engage in exercise at this level?: 40 min  Stress: Not on file  Social Connections: Moderately Isolated (08/14/2023)   Received from [facility]   Social Connection and Isolation Panel    In a typical week, how many times do you talk on the phone with family, friends, or neighbors?: Three times a week    How often do you get together with friends or relatives?: More than three times a week    How often do you attend church or religious services?:  More than 4 times per year    Do you belong to any clubs or organizations such as church groups, unions, fraternal or athletic groups, or school groups?: No    How often do you attend meetings of the clubs or organizations you belong to?: Never    Are you married, widowed, divorced, separated, never married, or living with a partner?: Widowed  Intimate Partner Violence: Not on file    Vital Signs: There were no vitals taken for this visit. There is no height or weight on file to calculate BMI.    Examination: General Appearance: The patient is well-developed, well-nourished, and in no distress. Neck Circumference: 44cm Skin: Gross inspection of skin unremarkable. Head: normocephalic, no gross deformities. Eyes: no gross deformities noted. ENT: ears appear grossly normal Neurologic: Alert  and oriented. No involuntary movements.  STOP BANG RISK ASSESSMENT S (snore) Have you been told that you snore?     No   T (tired) Are you often tired, fatigued, or sleepy during the day?   NO  O (obstruction) Do you stop breathing, choke, or gasp during sleep? NO   P (pressure) Do you have or are you being treated for high blood pressure? NO   B (BMI) Is your body index greater than 35 kg/m? NO   A (age) Are you 77 years old or older? YES   N (neck) Do you have a neck circumference greater than 16 inches?   YES   G (gender) Are you a female? NO   TOTAL STOP/BANG "YES" ANSWERS 2       A STOP-Bang score of 2 or less is considered low risk, and a score of 5 or more is high risk for having either moderate or severe OSA. For people who score 3 or 4, doctors may need to perform further assessment to determine how likely they are to have OSA.         EPWORTH SLEEPINESS SCALE:  Scale:  (0)= no chance of dozing; (1)= slight chance of dozing; (2)= moderate chance of dozing; (3)= high chance of dozing  Chance  Situtation    Sitting and reading: 0    Watching TV: 0    Sitting Inactive in  public: 0    As a passenger in car: 1      Lying down to rest: 2    Sitting and talking: 0    Sitting quielty after lunch: 0    In a car, stopped in traffic: 0   TOTAL SCORE:   3 out of 24    SLEEP STUDIES:  PSG (04/2018) AHI 54/hr, min SpO2 79% Titration (04/2018) 1 month trial of APAP@ 6-18 cmH2O. If central events persist, recommend BiPAP ASV titration.   CPAP COMPLIANCE DATA:  Date Range: 04/13/2024-10/09/2024  Average Daily Use: 6 hours 42 minutes  Median Use: 6 hours 49 minutes  Compliance for > 4 Hours: 77%  AHI: 5.2 respiratory events per hour  Days Used: 153/180 days  Mask Leak: 15.7  95th Percentile Pressure: 14.5         LABS: Recent Results (from the past 2160 hours)  GeneConnect Molecular Screen-Saliva     Status: None   Collection Time: 08/02/24 12:11 PM  Result Value Ref Range   Genetic Analysis Overall Interpretation Negative    Genetic Disease Assessed      This is a screening test and does not detect all pathogenic or likely pathogenic variant(s) in the tested genes; diagnostic testing is recommended for individuals with a personal or family history of heart disease or hereditary cancer. [facility] Tier One  Population Screen is a screening test that analyzes 11 genes related to hereditary breast and ovarian cancer (HBOC) syndrome, Lynch syndrome, and familial hypercholesterolemia. This test only reports clinically significant pathogenic and likely  pathogenic variants but does not report variants of uncertain significance (VUS). In addition, analysis of the PMS2 gene excludes exons 11-15, which overlap with a known pseudogene (PMS2CL).    Genetic Analysis Report      No pathogenic or likely pathogenic variants were detected in the genes analyzed by this test.Genetic test results should be interpreted in the context of an individual's personal medical and family history. Alteration to medical management is NOT  recommended based solely on this  result. Clinical correlation is  advised.Additional Considerations- This is a screening test; individuals may still carry pathogenic or likely pathogenic variant(s) in the tested genes that are not detected by this test.-  For individuals at risk for these or other related conditions based on factors including personal or family history, diagnostic testing is recommended.- The absence of pathogenic or likely pathogenic variant(s) in the analyzed genes, while reassuring,  does not eliminate the possibility of a hereditary condition; there are other variants and genes associated with heart disease and hereditary cancer that are not included in this test.    Genes Tested See Notes     Comment: APOB, BRCA1, BRCA2, EPCAM, LDLR, LDLRAP1, PCSK9, PMS2, MLH1, MSH2, MSH6   Disclaimer See Notes     Comment: This test was developed and validated by [facility] This test has not been cleared or approved by the [place]  Food and Drug Administration (FDA). The [facility] laboratory is accredited by the College of American Pathologists (CAP) and certified under  the Clinical Laboratory Improvement Amendments (CLIA #: [id number]) to perform high-complexity clinical tests. This test is used for clinical purposes. It should not be regarded as investigational use only or for research use only.    Sequencing Location See Notes     Comment: Sequencing done at [facility] [address], [address], [place] [address] (CLIA# [id number])   Interpretation Methods and Limitations See Notes     Comment: Extracted DNA is enriched for targeted regions and then sequenced using the [facility] Exome+ (R) assay on an Illumina DNA sequencing system. Data is then aligned to a modified version of GRCh38 and all genes are analyzed using the MANE transcript and MANE  Plus Clinical transcript, when available. Small variant calling is completed using a customized version of [employer]'s DNAseq software, augmented by a proprietary small  variant caller for difficult variants. Copy number variants (CNVs) are then called  using a proprietary bioinformatics pipeline based on depth analysis with a comparison to similarly sequenced samples. Analysis of the PMS2 gene is limited to exons 1-10. The interpretation and reporting of variants in APOB, PCSK9, and LDLR is specific to  familial hypercholesterolemia; variants associated with hypobetalipoproteinemia are not included. Interpretation is based upon guidelines published by the [employer] and Gen[employer]), the Association for Mol ecular Pathology  (AMP) or their modification by [employer] when available and/or review of previous clinical assertions available in the [employer]. Interpretation is limited to the transcripts indicated on the report and +/- 10 bp into  intronic regions, except as noted below. [facility] variant classifications include pathogenic, likely pathogenic, variant of uncertain significance (VUS), likely benign, and benign. Only variants classified as pathogenic and likely pathogenic are included in  the report. All reported variants are confirmed through secondary manual inspection of DNA sequence data or orthogonal testing. Risk estimations and management guidelines included in this report are based on analysis of primary literature and  recommendations of applicable professional societies, and should be regarded as approximations.Based on validation studies,this assay delivers > 99% sensitivity and specificity for single nucleotide variants and insertions  and deletions (indels) up to 20  bp. Larger indels and complex variants are also reported but sensitivity may be reduced. Based on validation studies, this assay delivers > 99% sensitivity to multi-exon CNVs and > 90% sensitivity to single-exon CNVs. This test may not detect variants  in challenging regions (such as short tandem repeats, homopolymer runs, and  segment duplications), sub-exonic CNVs,  chromosomal aneuploidy, or variants in the presence of mosaicism. Phasing will be attempted and reported, when possible. Structural  rearrangements such as inversions, translocations, and gene conversions are not tested in this assay unless explicitly indicated. Additionally, deep intronic, promoter, and enhancer regions may not be covered. It is important to note that this is a  screening test and cannot detect all disease-causing variants. A negative result does not guarantee the absence of a rare, undetectable variant in the genes analyzed; consider using a diagnostic test if there is  significant personal and/or family history  of one of the conditions analyzed by this test. Any potential incidental findings outside of these genes and conditions will not be identified, nor reported. The results of a genetic test may be influenced by various factors, including bone marrow  transplantation, blood transfusions, or in rare cases, hematolymphoid neoplasms.Gene Specific Notes:APOB: analysis is limited to c.10580G>A and c.10579C>T; BRCA1: sequencing analysis extends to CDS +/-20 bp; BRCA2: sequencing analysis extends to CDS  +/-20 bp. EPCAM: analysis is limited to CNVof exons 8-9; LDLR: analysis includes CNV ofthe promoter; MLH1: analysis includes CNV of the promoter; PMS2: analysis is limited to exons 1-10.Donnice JINNY Kemp, PhD, [email or web address]     Radiology: No results found.  No results found.  No results found.    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 (CRT-D) 07/21/2022   OSA on CPAP 10/05/2020   CPAP use counseling 10/05/2020   Chronic systolic heart failure (HCC) 10/05/2020   Colovesical fistula 03/10/2020   Biventricular automatic implantable cardioverter defibrillator in situ 07/04/2019   Chronic systolic HF (heart failure) (HCC)  05/31/2019   PMR (polymyalgia rheumatica) 07/08/2016   History of right hip replacement 04/27/2016   Primary osteoarthritis of both hips 04/27/2016   Cardiomyopathy, nonischemic (HCC) 02/16/2016   Joint stiffness 02/08/2016   LBBB (left bundle branch block) 06/24/2014   Mitral insufficiency 07/03/2013   Type 2 diabetes mellitus, controlled (HCC) 05/27/2013   History of breast cancer 05/27/2013   Diverticula of colon 05/27/2013   Lymphedema 05/27/2013   Osteoarthritis 05/27/2013   Pure hypercholesterolemia 05/27/2013   Thyroid adenoma 05/27/2013    1. OSA on CPAP (Primary) The patient does tolerate PAP and reports  benefit from PAP use. The patient was reminded how to clean equipment and advised to replace supplies routinely. The patient was also counselled on weight loss. The compliance is good. The AHI is 5.2, slightly above target but I think acceptably controlled. .   OSA- continue with compliance with pap. F/u one year.    2. CPAP use counseling CPAP Counseling: had a lengthy discussion with the patient regarding the importance of PAP therapy in management of the sleep apnea. Patient appears to understand the risk factor reduction and also understands the risks associated with untreated sleep apnea. Patient will try to make a good faith effort to remain compliant with therapy. Also instructed the patient on proper cleaning of the device including the water must be changed daily if possible and use of distilled water is preferred. Patient understands that the machine should be regularly cleaned with appropriate recommended cleaning solutions that do not damage the PAP machine for example given white vinegar and water rinses. Other methods such as ozone treatment may not be as good as these simple methods to achieve cleaning.     General Counseling: I have discussed the findings of the evaluation and examination with Natasha Young.  I  have also discussed any further diagnostic evaluation  thatmay be needed or ordered today. Natasha Young verbalizes understanding of the findings of todays visit. We also reviewed her medications today and discussed drug interactions and side effects including but not limited excessive drowsiness and altered mental states. We also discussed that there is always a risk not just to her but also people around her. she has been encouraged to call the office with any questions or concerns that should arise related to todays visit.  No orders of the defined types were placed in this encounter.       I have personally obtained a history, examined the patient, evaluated laboratory and imaging results, formulated the assessment and plan and placed orders. This patient was seen today by Lauraine Lay, PA-C in collaboration with Dr. Elfreda Bathe.   Elfreda DELENA Bathe, MD [facility] Diplomate ABMS Pulmonary Critical Care Medicine and Sleep Medicine

## 2024-10-14 ENCOUNTER — Ambulatory Visit (INDEPENDENT_AMBULATORY_CARE_PROVIDER_SITE_OTHER): Admitting: Internal Medicine

## 2024-10-14 VITALS — BP 124/76 | HR 79 | Resp 12 | Ht 63.0 in | Wt 180.8 lb

## 2024-10-14 DIAGNOSIS — Z7189 Other specified counseling: Secondary | ICD-10-CM | POA: Diagnosis not present

## 2024-10-14 DIAGNOSIS — G4733 Obstructive sleep apnea (adult) (pediatric): Secondary | ICD-10-CM | POA: Diagnosis not present

## 2024-10-14 NOTE — Patient Instructions (Signed)

## 2024-11-06 ENCOUNTER — Other Ambulatory Visit: Payer: Self-pay | Admitting: Otolaryngology

## 2024-11-06 DIAGNOSIS — E041 Nontoxic single thyroid nodule: Secondary | ICD-10-CM

## 2024-11-12 ENCOUNTER — Ambulatory Visit: Admission: RE | Admit: 2024-11-12 | Discharge: 2024-11-12 | Attending: Otolaryngology | Admitting: Otolaryngology

## 2024-11-12 DIAGNOSIS — E041 Nontoxic single thyroid nodule: Secondary | ICD-10-CM
